# Patient Record
Sex: Male | Born: 1960 | Race: White | Hispanic: No | Smoking: Current every day smoker
Health system: Southern US, Community
[De-identification: ages and names within clinical notes are randomized; demographics above are authoritative.]

## PROBLEM LIST (undated history)

## (undated) DIAGNOSIS — F419 Anxiety disorder, unspecified: Secondary | ICD-10-CM

## (undated) DIAGNOSIS — R0600 Dyspnea, unspecified: Secondary | ICD-10-CM

## (undated) DIAGNOSIS — G8921 Chronic pain due to trauma: Secondary | ICD-10-CM

## (undated) DIAGNOSIS — F329 Major depressive disorder, single episode, unspecified: Secondary | ICD-10-CM

## (undated) DIAGNOSIS — F32A Depression, unspecified: Secondary | ICD-10-CM

## (undated) DIAGNOSIS — G709 Myoneural disorder, unspecified: Secondary | ICD-10-CM

## (undated) DIAGNOSIS — K219 Gastro-esophageal reflux disease without esophagitis: Secondary | ICD-10-CM

---

## 1997-09-27 ENCOUNTER — Other Ambulatory Visit: Admission: RE | Admit: 1997-09-27 | Discharge: 1997-09-27 | Payer: Self-pay | Admitting: Urology

## 1998-05-03 HISTORY — PX: KNEE ARTHROSCOPY W/ ACL RECONSTRUCTION: SHX1858

## 2000-01-05 ENCOUNTER — Encounter: Admission: RE | Admit: 2000-01-05 | Discharge: 2000-01-05 | Payer: Self-pay | Admitting: Occupational Medicine

## 2000-01-05 ENCOUNTER — Encounter: Payer: Self-pay | Admitting: Occupational Medicine

## 2000-07-25 ENCOUNTER — Encounter: Payer: Self-pay | Admitting: Specialist

## 2000-07-25 ENCOUNTER — Ambulatory Visit (HOSPITAL_COMMUNITY): Admission: RE | Admit: 2000-07-25 | Discharge: 2000-07-25 | Payer: Self-pay | Admitting: Specialist

## 2004-04-26 ENCOUNTER — Emergency Department (HOSPITAL_COMMUNITY): Admission: EM | Admit: 2004-04-26 | Discharge: 2004-04-26 | Payer: Self-pay | Admitting: Emergency Medicine

## 2004-05-01 ENCOUNTER — Ambulatory Visit: Payer: Self-pay | Admitting: Internal Medicine

## 2004-06-23 ENCOUNTER — Ambulatory Visit: Payer: Self-pay | Admitting: Internal Medicine

## 2004-06-29 ENCOUNTER — Ambulatory Visit: Payer: Self-pay | Admitting: Internal Medicine

## 2004-08-17 ENCOUNTER — Ambulatory Visit: Payer: Self-pay | Admitting: Internal Medicine

## 2004-10-16 ENCOUNTER — Ambulatory Visit: Payer: Self-pay | Admitting: Internal Medicine

## 2004-11-06 ENCOUNTER — Ambulatory Visit: Payer: Self-pay | Admitting: Internal Medicine

## 2005-07-09 ENCOUNTER — Ambulatory Visit: Payer: Self-pay | Admitting: Internal Medicine

## 2005-08-13 ENCOUNTER — Ambulatory Visit: Payer: Self-pay | Admitting: Internal Medicine

## 2005-08-27 ENCOUNTER — Ambulatory Visit: Payer: Self-pay | Admitting: Internal Medicine

## 2006-10-17 ENCOUNTER — Ambulatory Visit: Payer: Self-pay | Admitting: Internal Medicine

## 2013-02-14 ENCOUNTER — Emergency Department (HOSPITAL_COMMUNITY)
Admission: EM | Admit: 2013-02-14 | Discharge: 2013-02-14 | Disposition: A | Discharge status: Home / Self Care | Payer: BC Managed Care – PPO | Attending: Emergency Medicine | Admitting: Emergency Medicine

## 2013-02-14 ENCOUNTER — Encounter (HOSPITAL_COMMUNITY): Payer: Self-pay | Admitting: Emergency Medicine

## 2013-02-14 DIAGNOSIS — F17200 Nicotine dependence, unspecified, uncomplicated: Secondary | ICD-10-CM | POA: Insufficient documentation

## 2013-02-14 DIAGNOSIS — F329 Major depressive disorder, single episode, unspecified: Secondary | ICD-10-CM | POA: Insufficient documentation

## 2013-02-14 DIAGNOSIS — F1910 Other psychoactive substance abuse, uncomplicated: Secondary | ICD-10-CM

## 2013-02-14 DIAGNOSIS — F3289 Other specified depressive episodes: Secondary | ICD-10-CM | POA: Insufficient documentation

## 2013-02-14 LAB — CBC WITH DIFFERENTIAL/PLATELET
Basophils Absolute: 0 10*3/uL (ref 0.0–0.1)
Basophils Relative: 1 % (ref 0–1)
Eosinophils Absolute: 0.1 10*3/uL (ref 0.0–0.7)
Eosinophils Relative: 2 % (ref 0–5)
HCT: 43.7 % (ref 39.0–52.0)
Hemoglobin: 15.3 g/dL (ref 13.0–17.0)
Lymphocytes Relative: 16 % (ref 12–46)
Lymphs Abs: 1.2 10*3/uL (ref 0.7–4.0)
MCH: 34.6 pg — ABNORMAL HIGH (ref 26.0–34.0)
MCHC: 35 g/dL (ref 30.0–36.0)
MCV: 98.9 fL (ref 78.0–100.0)
Monocytes Absolute: 0.6 10*3/uL (ref 0.1–1.0)
Monocytes Relative: 8 % (ref 3–12)
Neutro Abs: 5.5 10*3/uL (ref 1.7–7.7)
Neutrophils Relative %: 73 % (ref 43–77)
Platelets: 318 10*3/uL (ref 150–400)
RBC: 4.42 MIL/uL (ref 4.22–5.81)
RDW: 12.2 % (ref 11.5–15.5)
WBC: 7.5 10*3/uL (ref 4.0–10.5)

## 2013-02-14 LAB — BASIC METABOLIC PANEL
BUN: 9 mg/dL (ref 6–23)
CO2: 27 mEq/L (ref 19–32)
Calcium: 9.3 mg/dL (ref 8.4–10.5)
Chloride: 98 mEq/L (ref 96–112)
Creatinine, Ser: 0.83 mg/dL (ref 0.50–1.35)
GFR calc Af Amer: >90 mL/min (ref >90)
GFR calc non Af Amer: >90 mL/min (ref >90)
Glucose, Bld: 105 mg/dL — ABNORMAL HIGH (ref 70–99)
Potassium: 4.6 mEq/L (ref 3.5–5.1)
Sodium: 135 mEq/L (ref 135–145)

## 2013-02-14 LAB — RAPID URINE DRUG SCREEN, HOSP PERFORMED
Amphetamines: NOT DETECTED
Barbiturates: NOT DETECTED
Benzodiazepines: POSITIVE — AB
Cocaine: NOT DETECTED
Opiates: NOT DETECTED
Tetrahydrocannabinol: POSITIVE — AB

## 2013-02-14 LAB — ETHANOL: Alcohol, Ethyl (B): <11 mg/dL (ref 0–11)

## 2013-02-14 NOTE — ED Notes (Signed)
Pt reports went to mental health today for help with detox from [facility]oh.  Reports was told his bp was high and was sent here for medical clearance.  Denies any SI or HI.

## 2013-02-14 NOTE — ED Provider Notes (Signed)
CSN: [id number]     Arrival date & time 02/14/13  1152 History   First MD Initiated Contact with Patient 02/14/13 1430     Chief Complaint  Patient presents with  . V70.1   (Consider location/radiation/quality/duration/timing/severity/associated sxs/prior Treatment) Patient is a 52 y.o. male presenting with altered mental status. The history is provided by the patient (pt was sent from [facility] for medical clearance and he is to back to [facility] tomorrow for treatment). No language interpreter was used.  Altered Mental Status Presenting symptoms: no behavior changes   Severity:  Moderate Most recent episode:  Today Episode history:  Multiple Timing:  Constant Chronicity:  Recurrent Associated symptoms: no abdominal pain, no hallucinations, no headaches, no rash and no seizures     History reviewed. No pertinent past medical history. History reviewed. No pertinent past surgical history. No family history on file. History  Substance Use Topics  . Smoking status: Current Every Day Smoker  . Smokeless tobacco: Not on file  . Alcohol Use: Yes     Comment: 12pack/day    Review of Systems  Constitutional: Negative for appetite change and fatigue.  HENT: Negative for congestion, ear discharge and sinus pressure.   Eyes: Negative for discharge.  Respiratory: Negative for cough.   Cardiovascular: Negative for chest pain.  Gastrointestinal: Negative for abdominal pain and diarrhea.  Genitourinary: Negative for frequency and hematuria.  Musculoskeletal: Negative for back pain.  Skin: Negative for rash.  Neurological: Negative for seizures and headaches.  Psychiatric/Behavioral: Positive for dysphoric mood. Negative for hallucinations.       Substance abuse    Allergies  Review of patient's allergies indicates no known allergies.  Home Medications   Current Outpatient Rx  Name  Route  Sig  Dispense  Refill  . ibuprofen (ADVIL,MOTRIN) 200 MG tablet   Oral   Take 400 mg by mouth  every 6 (six) hours as needed for pain.          BP 151/83  Pulse 82  Temp(Src) 98.5 F (36.9 C) (Oral)  Resp 20  Ht 5\' 8"  (1.727 m)  Wt 185 lb 5 oz (84.057 kg)  BMI 28.18 kg/m2  SpO2 96% Physical Exam  Constitutional: He is oriented to person, place, and time. He appears well-developed.  HENT:  Head: Normocephalic.  Eyes: Conjunctivae and EOM are normal. No scleral icterus.  Neck: Neck supple. No thyromegaly present.  Cardiovascular: Normal rate and regular rhythm.  Exam reveals no gallop and no friction rub.   No murmur heard. Pulmonary/Chest: No stridor. He has no wheezes. He has no rales. He exhibits no tenderness.  Abdominal: He exhibits no distension. There is no tenderness. There is no rebound.  Musculoskeletal: Normal range of motion. He exhibits no edema.  Lymphadenopathy:    He has no cervical adenopathy.  Neurological: He is oriented to person, place, and time. He exhibits normal muscle tone. Coordination normal.  Skin: No rash noted. No erythema.  Psychiatric:  Depressed not suicidal    ED Course  Procedures (including critical care time) Labs Review Labs Reviewed  CBC WITH DIFFERENTIAL - Abnormal; Notable for the following:    MCH 34.6 (*)    All other components within normal limits  BASIC METABOLIC PANEL - Abnormal; Notable for the following:    Glucose, Bld 105 (*)    All other components within normal limits  ETHANOL  URINE RAPID DRUG SCREEN (HOSP PERFORMED)   Imaging Review No results found.  EKG Interpretation   None  MDM   1. Substance abuse       Benny Lennert, MD 02/14/13 [phone]

## 2013-02-14 NOTE — ED Notes (Signed)
Pt presents from [facility] for medical clearance of hypertension so as he can seek treatment for alcohol abuse. Pt reports has " drank most of his life". Denies SI/HI.

## 2013-02-14 NOTE — ED Notes (Signed)
Pt wanded by security in triage. 

## 2014-05-03 DIAGNOSIS — G8921 Chronic pain due to trauma: Secondary | ICD-10-CM

## 2014-05-03 HISTORY — DX: Chronic pain due to trauma: G89.21

## 2015-02-14 HISTORY — PX: HAND SURGERY: SHX662

## 2015-08-18 ENCOUNTER — Other Ambulatory Visit (HOSPITAL_COMMUNITY): Payer: Self-pay | Admitting: Physical Medicine and Rehabilitation

## 2015-08-18 DIAGNOSIS — M79641 Pain in right hand: Secondary | ICD-10-CM

## 2015-08-21 ENCOUNTER — Encounter (HOSPITAL_COMMUNITY): Payer: Self-pay

## 2015-08-26 ENCOUNTER — Encounter (HOSPITAL_COMMUNITY)
Admission: RE | Admit: 2015-08-26 | Discharge: 2015-08-26 | Disposition: A | Discharge status: Home / Self Care | Payer: Worker's Compensation | Source: Ambulatory Visit | Attending: Physical Medicine and Rehabilitation | Admitting: Physical Medicine and Rehabilitation

## 2015-08-26 DIAGNOSIS — M79641 Pain in right hand: Secondary | ICD-10-CM | POA: Insufficient documentation

## 2015-08-26 MED ORDER — TECHNETIUM TC 99M MEDRONATE IV KIT
25.0000 | PACK | Freq: Once | INTRAVENOUS | Status: AC | PRN
  Administered 2015-08-26: 25 via INTRAVENOUS

## 2016-06-17 ENCOUNTER — Other Ambulatory Visit: Payer: Self-pay | Admitting: Orthopedic Surgery

## 2016-07-01 ENCOUNTER — Encounter (HOSPITAL_BASED_OUTPATIENT_CLINIC_OR_DEPARTMENT_OTHER): Payer: Self-pay | Admitting: *Deleted

## 2016-07-06 ENCOUNTER — Ambulatory Visit (HOSPITAL_BASED_OUTPATIENT_CLINIC_OR_DEPARTMENT_OTHER): Payer: Worker's Compensation | Admitting: Certified Registered"

## 2016-07-06 ENCOUNTER — Ambulatory Visit (HOSPITAL_BASED_OUTPATIENT_CLINIC_OR_DEPARTMENT_OTHER)
Admission: RE | Admit: 2016-07-06 | Discharge: 2016-07-06 | Disposition: A | Discharge status: Home / Self Care | Payer: Worker's Compensation | Source: Ambulatory Visit | Attending: Orthopedic Surgery | Admitting: Orthopedic Surgery

## 2016-07-06 ENCOUNTER — Encounter (HOSPITAL_BASED_OUTPATIENT_CLINIC_OR_DEPARTMENT_OTHER)
Admission: RE | Disposition: A | Discharge status: Home / Self Care | Payer: Self-pay | Source: Ambulatory Visit | Attending: Orthopedic Surgery

## 2016-07-06 ENCOUNTER — Encounter (HOSPITAL_BASED_OUTPATIENT_CLINIC_OR_DEPARTMENT_OTHER): Payer: Self-pay | Admitting: Certified Registered"

## 2016-07-06 DIAGNOSIS — S6722XD Crushing injury of left hand, subsequent encounter: Secondary | ICD-10-CM | POA: Diagnosis not present

## 2016-07-06 DIAGNOSIS — I10 Essential (primary) hypertension: Secondary | ICD-10-CM | POA: Insufficient documentation

## 2016-07-06 DIAGNOSIS — F419 Anxiety disorder, unspecified: Secondary | ICD-10-CM | POA: Diagnosis not present

## 2016-07-06 DIAGNOSIS — S6721XD Crushing injury of right hand, subsequent encounter: Secondary | ICD-10-CM | POA: Insufficient documentation

## 2016-07-06 DIAGNOSIS — I2510 Atherosclerotic heart disease of native coronary artery without angina pectoris: Secondary | ICD-10-CM | POA: Insufficient documentation

## 2016-07-06 DIAGNOSIS — K219 Gastro-esophageal reflux disease without esophagitis: Secondary | ICD-10-CM | POA: Insufficient documentation

## 2016-07-06 DIAGNOSIS — J449 Chronic obstructive pulmonary disease, unspecified: Secondary | ICD-10-CM | POA: Insufficient documentation

## 2016-07-06 DIAGNOSIS — G8929 Other chronic pain: Secondary | ICD-10-CM | POA: Insufficient documentation

## 2016-07-06 DIAGNOSIS — X58XXXD Exposure to other specified factors, subsequent encounter: Secondary | ICD-10-CM | POA: Diagnosis not present

## 2016-07-06 DIAGNOSIS — M24542 Contracture, left hand: Secondary | ICD-10-CM | POA: Diagnosis not present

## 2016-07-06 DIAGNOSIS — F329 Major depressive disorder, single episode, unspecified: Secondary | ICD-10-CM | POA: Diagnosis not present

## 2016-07-06 DIAGNOSIS — G5602 Carpal tunnel syndrome, left upper limb: Secondary | ICD-10-CM | POA: Diagnosis present

## 2016-07-06 DIAGNOSIS — G5603 Carpal tunnel syndrome, bilateral upper limbs: Secondary | ICD-10-CM | POA: Diagnosis not present

## 2016-07-06 DIAGNOSIS — G5622 Lesion of ulnar nerve, left upper limb: Secondary | ICD-10-CM | POA: Diagnosis not present

## 2016-07-06 DIAGNOSIS — F17210 Nicotine dependence, cigarettes, uncomplicated: Secondary | ICD-10-CM | POA: Diagnosis not present

## 2016-07-06 HISTORY — DX: Myoneural disorder, unspecified: G70.9

## 2016-07-06 HISTORY — PX: ULNAR NERVE TRANSPOSITION: SHX2595

## 2016-07-06 HISTORY — DX: Chronic pain due to trauma: G89.21

## 2016-07-06 HISTORY — DX: Major depressive disorder, single episode, unspecified: F32.9

## 2016-07-06 HISTORY — DX: Anxiety disorder, unspecified: F41.9

## 2016-07-06 HISTORY — DX: Depression, unspecified: F32.A

## 2016-07-06 HISTORY — DX: Dyspnea, unspecified: R06.00

## 2016-07-06 HISTORY — DX: Gastro-esophageal reflux disease without esophagitis: K21.9

## 2016-07-06 HISTORY — PX: CARPAL TUNNEL RELEASE: SHX101

## 2016-07-06 SURGERY — CARPAL TUNNEL RELEASE
Anesthesia: Regional | Site: Wrist | Laterality: Left

## 2016-07-06 MED ORDER — MIDAZOLAM HCL 2 MG/2ML IJ SOLN
1.0000 mg | INTRAMUSCULAR | Status: DC | PRN
  Administered 2016-07-06: 1 mg via INTRAVENOUS

## 2016-07-06 MED ORDER — ONDANSETRON HCL 4 MG/2ML IJ SOLN
INTRAMUSCULAR | Status: DC | PRN
  Administered 2016-07-06: 4 mg via INTRAVENOUS

## 2016-07-06 MED ORDER — CEFAZOLIN SODIUM-DEXTROSE 2-4 GM/100ML-% IV SOLN
2.0000 g | INTRAVENOUS | Status: AC
  Administered 2016-07-06: 2 g via INTRAVENOUS

## 2016-07-06 MED ORDER — MEPERIDINE HCL 25 MG/ML IJ SOLN: 6.2500 mg | INTRAMUSCULAR | Status: DC | PRN

## 2016-07-06 MED ORDER — LIDOCAINE HCL (CARDIAC) 20 MG/ML IV SOLN
INTRAVENOUS | Status: DC | PRN
  Administered 2016-07-06: 20 mg via INTRAVENOUS

## 2016-07-06 MED ORDER — FENTANYL CITRATE (PF) 100 MCG/2ML IJ SOLN
INTRAMUSCULAR | Status: AC
Start: 2016-07-06 — End: 2016-07-06
  Filled 2016-07-06: qty 2

## 2016-07-06 MED ORDER — FENTANYL CITRATE (PF) 100 MCG/2ML IJ SOLN
50.0000 ug | INTRAMUSCULAR | Status: DC | PRN
  Administered 2016-07-06: 50 ug via INTRAVENOUS

## 2016-07-06 MED ORDER — LIDOCAINE HCL (PF) 1 % IJ SOLN
INTRAMUSCULAR | Status: AC
  Filled 2016-07-06: qty 30

## 2016-07-06 MED ORDER — LACTATED RINGERS IV SOLN
INTRAVENOUS | Status: DC
  Administered 2016-07-06: 12:00:00 via INTRAVENOUS

## 2016-07-06 MED ORDER — CHLORHEXIDINE GLUCONATE 4 % EX LIQD: 60.0000 mL | Freq: Once | CUTANEOUS | Status: DC

## 2016-07-06 MED ORDER — CEFAZOLIN SODIUM-DEXTROSE 2-4 GM/100ML-% IV SOLN
INTRAVENOUS | Status: AC
  Filled 2016-07-06: qty 100

## 2016-07-06 MED ORDER — PROPOFOL 500 MG/50ML IV EMUL
INTRAVENOUS | Status: DC | PRN
  Administered 2016-07-06: 25 ug/kg/min via INTRAVENOUS

## 2016-07-06 MED ORDER — HYDROCODONE-ACETAMINOPHEN 5-325 MG PO TABS: 1.0000 | ORAL_TABLET | Freq: Four times a day (QID) | ORAL | 0 refills | Status: DC | PRN

## 2016-07-06 MED ORDER — BUPIVACAINE HCL (PF) 0.25 % IJ SOLN
INTRAMUSCULAR | Status: AC
  Filled 2016-07-06: qty 30

## 2016-07-06 MED ORDER — SCOPOLAMINE 1 MG/3DAYS TD PT72: 1.0000 | MEDICATED_PATCH | Freq: Once | TRANSDERMAL | Status: DC | PRN

## 2016-07-06 MED ORDER — MIDAZOLAM HCL 2 MG/2ML IJ SOLN
INTRAMUSCULAR | Status: AC
  Filled 2016-07-06: qty 2

## 2016-07-06 MED ORDER — PROMETHAZINE HCL 25 MG/ML IJ SOLN: 6.2500 mg | INTRAMUSCULAR | Status: DC | PRN

## 2016-07-06 MED ORDER — BUPIVACAINE-EPINEPHRINE (PF) 0.5% -1:200000 IJ SOLN
INTRAMUSCULAR | Status: DC | PRN
  Administered 2016-07-06: 30 mL via PERINEURAL

## 2016-07-06 MED ORDER — BUPIVACAINE HCL (PF) 0.25 % IJ SOLN
INTRAMUSCULAR | Status: DC | PRN
  Administered 2016-07-06: 6 mL

## 2016-07-06 MED ORDER — PROPOFOL 500 MG/50ML IV EMUL
INTRAVENOUS | Status: AC
  Filled 2016-07-06: qty 50

## 2016-07-06 SURGICAL SUPPLY — 55 items
BLADE MINI RND TIP GREEN BEAV (BLADE) IMPLANT
BLADE SURG 15 STRL LF DISP TIS (BLADE) ×2 IMPLANT
BLADE SURG 15 STRL SS (BLADE) ×4
BNDG CMPR 9X4 STRL LF SNTH (GAUZE/BANDAGES/DRESSINGS) ×2
BNDG COHESIVE 3X5 TAN STRL LF (GAUZE/BANDAGES/DRESSINGS) ×6 IMPLANT
BNDG ESMARK 4X9 LF (GAUZE/BANDAGES/DRESSINGS) ×4 IMPLANT
BNDG GAUZE ELAST 4 BULKY (GAUZE/BANDAGES/DRESSINGS) ×4 IMPLANT
CHLORAPREP W/TINT 26ML (MISCELLANEOUS) ×4 IMPLANT
CORDS BIPOLAR (ELECTRODE) ×4 IMPLANT
COVER BACK TABLE 60X90IN (DRAPES) ×4 IMPLANT
COVER MAYO STAND STRL (DRAPES) ×4 IMPLANT
CUFF TOURN SGL LL 18 NRW (TOURNIQUET CUFF) ×2 IMPLANT
CUFF TOURNIQUET SINGLE 18IN (TOURNIQUET CUFF) ×6 IMPLANT
DECANTER SPIKE VIAL GLASS SM (MISCELLANEOUS) IMPLANT
DRAPE EXTREMITY T 121X128X90 (DRAPE) ×4 IMPLANT
DRAPE SURG 17X23 STRL (DRAPES) ×4 IMPLANT
DRSG PAD ABDOMINAL 8X10 ST (GAUZE/BANDAGES/DRESSINGS) ×4 IMPLANT
GAUZE SPONGE 4X4 12PLY STRL (GAUZE/BANDAGES/DRESSINGS) ×4 IMPLANT
GAUZE SPONGE 4X4 16PLY XRAY LF (GAUZE/BANDAGES/DRESSINGS) IMPLANT
GAUZE XEROFORM 1X8 LF (GAUZE/BANDAGES/DRESSINGS) ×4 IMPLANT
GLOVE BIO SURGEON STRL SZ 6.5 (GLOVE) ×1 IMPLANT
GLOVE BIO SURGEONS STRL SZ 6.5 (GLOVE) ×1
GLOVE BIOGEL PI IND STRL 7.0 (GLOVE) IMPLANT
GLOVE BIOGEL PI IND STRL 8.5 (GLOVE) ×2 IMPLANT
GLOVE BIOGEL PI INDICATOR 7.0 (GLOVE) ×4
GLOVE BIOGEL PI INDICATOR 8.5 (GLOVE) ×2
GLOVE SURG ORTHO 8.0 STRL STRW (GLOVE) ×4 IMPLANT
GLOVE SURG SYN 7.5  E (GLOVE) ×2
GLOVE SURG SYN 7.5 E (GLOVE) ×2 IMPLANT
GLOVE SURG SYN 7.5 PF PI (GLOVE) IMPLANT
GOWN STRL REUS W/ TWL LRG LVL3 (GOWN DISPOSABLE) ×2 IMPLANT
GOWN STRL REUS W/TWL LRG LVL3 (GOWN DISPOSABLE) ×4
GOWN STRL REUS W/TWL XL LVL3 (GOWN DISPOSABLE) ×4 IMPLANT
LOOP VESSEL MAXI BLUE (MISCELLANEOUS) IMPLANT
NDL PRECISIONGLIDE 27X1.5 (NEEDLE) IMPLANT
NEEDLE PRECISIONGLIDE 27X1.5 (NEEDLE) IMPLANT
NS IRRIG 1000ML POUR BTL (IV SOLUTION) ×4 IMPLANT
PACK BASIN DAY SURGERY FS (CUSTOM PROCEDURE TRAY) ×4 IMPLANT
PAD CAST 3X4 CTTN HI CHSV (CAST SUPPLIES) IMPLANT
PAD CAST 4YDX4 CTTN HI CHSV (CAST SUPPLIES) ×2 IMPLANT
PADDING CAST COTTON 3X4 STRL (CAST SUPPLIES)
PADDING CAST COTTON 4X4 STRL (CAST SUPPLIES) ×4
SLEEVE SCD COMPRESS KNEE MED (MISCELLANEOUS) ×4 IMPLANT
SLING ARM FOAM STRAP LRG (SOFTGOODS) ×2 IMPLANT
SPLINT PLASTER CAST XFAST 3X15 (CAST SUPPLIES) IMPLANT
SPLINT PLASTER XTRA FASTSET 3X (CAST SUPPLIES)
STOCKINETTE 4X48 STRL (DRAPES) ×4 IMPLANT
SUT ETHILON 4 0 PS 2 18 (SUTURE) ×4 IMPLANT
SUT VIC AB 2-0 SH 27 (SUTURE) ×4
SUT VIC AB 2-0 SH 27XBRD (SUTURE) ×2 IMPLANT
SUT VICRYL 4-0 PS2 18IN ABS (SUTURE) ×4 IMPLANT
SYR BULB 3OZ (MISCELLANEOUS) ×4 IMPLANT
SYR CONTROL 10ML LL (SYRINGE) IMPLANT
TOWEL OR 17X24 6PK STRL BLUE (TOWEL DISPOSABLE) ×4 IMPLANT
UNDERPAD 30X30 (UNDERPADS AND DIAPERS) ×4 IMPLANT

## 2016-07-06 NOTE — Discharge Instructions (Addendum)

## 2016-07-06 NOTE — Anesthesia Procedure Notes (Addendum)
Anesthesia Regional Block: Supraclavicular block   Pre-Anesthetic Checklist: ,, timeout performed, Correct Patient, Correct Site, Correct Laterality, Correct Procedure, Correct Position, site marked, Risks and benefits discussed,  Surgical consent,  Pre-op evaluation,  At surgeon's request and post-op pain management  Laterality: Left  Prep: chloraprep       Needles:  Injection technique: Single-shot  Needle Type: Stimiplex          Additional Needles:   Procedures: ultrasound guided,,,,,,,,  Narrative:  Start time: 07/06/2016 12:00 PM End time: 07/06/2016 12:15 PM Injection made incrementally with aspirations every 5 mL.  Performed by: Personally  Anesthesiologist: Lewie LoronGERMEROTH, Clete  Additional Notes: Risks, benefits and alternative to block explained extensively.  Patient tolerated procedure well, without complications.

## 2016-07-06 NOTE — Progress Notes (Signed)
Assisted Dr. Germeroth with left, ultrasound guided, supraclavicular block. Side rails up, monitors on throughout procedure. See vital signs in flow sheet. Tolerated Procedure well. 

## 2016-07-06 NOTE — Anesthesia Procedure Notes (Signed)
Procedure Name: MAC Date/Time: 07/06/2016 12:31 PM Performed by: Orestes Geiman D Pre-anesthesia Checklist: Emergency Drugs available, Suction available, Patient being monitored, Patient identified and Timeout performed Patient Re-evaluated:Patient Re-evaluated prior to inductionOxygen Delivery Method: Simple face mask

## 2016-07-06 NOTE — H&P (Signed)
Fernando Holmes is an 56 y.o. male.   Chief Complaint: numbness and pain left hand HPI: Fernando Holmes is a 56 year old right-hand-dominant male seen at the request the insurance company for an independent medical evaluation. He sustained an injury to both hands when a metal break this came down on his hands which were inside as he was attempting to remove a portion of material. The injury occurred on March 07, 2015. Both hands were involved in the brake press. This occurred when his coworker accidentally hit the machine and I cycled it while his hands were still in the machine causing the machine to come down and clamped both hands. The left side was opened. This occurred through the metacarpals of each hand. He was seen at [facility] emergency room where these were managed and he was referred to Dr. Case. He underwent pinning of the fractures on his left side. He was treated with closed splinting on his right side. The day of surgery was 02/14/15. The pins were maintained for approximately 3-4 weeks. He was then begun on therapy both hands. He is now complaining of his hands locking up. He relates a sharp aching type pain with a VAS score of 8/10. His right is not as bad as his left. He is seen with his wife and she states that he will have spasms in his left shoulder. He complains of pain going up his arm on both sides of his hand and arm on his left side. He is complaining of pain in his right side in the midportion of his hand. He has no prior history of injuries. He has no history of injury to the neck. He was seen by Dr. [name] he states did very little for him. He subsequently had a bone scan done showing a delayed uptake in the first metacarpal carpal joints and the second and third metacarpals on his right side. He has had nerve conductions done by Dr. [name]. These were performed by Dr. [name] on 06/17/2015 . 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 Hs being  seen by Dr. [name] at the present time. He has had 3 stellate ganglion blocks. He states these have not given him significant relief. He has apparently been scheduled for a spinal cord stimulator. He is now taking trazodone and gabapentin which give him minimal relief. He continues to complain of a sharp aching pain on his left side with a VAS score of 8/10. He has had a psychological evaluation in addition. This was done by Dr. [name].He has been seeing Fernando Holmes for therapies. He states that his pain has slightly improved but is still present. He has been taking Celebrex which has given him some relief. He has seen some mobility of his fingers with increased grip strength With the therapy.He does complain of the numbness and tingling.          Past Medical History:  Diagnosis Date  . Anxiety   . Chronic pain due to injury 2016   hands crushed by hydrolic press  . Depression   . Dyspnea   . GERD (gastroesophageal reflux disease)   . Hypertension    has dx of HTN but does not see MD or take meds  . Neuromuscular disorder [facility])     Past Surgical History:  Procedure Laterality Date  . HAND SURGERY Left 02/14/2015   due to injury  . KNEE ARTHROSCOPY W/ ACL RECONSTRUCTION Left 2000    History reviewed. No  pertinent family history. Social History:  reports that he has been smoking Cigarettes.  He has a 40.00 pack-year smoking history. He has never used smokeless tobacco. He reports that he drinks alcohol. He reports that he uses drugs, including Marijuana.  Allergies: No Known Allergies  No prescriptions prior to admission.    No results found for this or any previous visit (from the past 48 hour(s)).  No results found.   Pertinent items are noted in HPI.  Height 5\' 8"  (1.727 m), weight 97.5 kg (215 lb).  General appearance: alert, cooperative and appears stated age Head: Normocephalic, without obvious abnormality Neck: no JVD Resp: clear to auscultation  bilaterally Cardio: regular rate and rhythm, S1, S2 normal, no murmur, click, rub or gallop GI: soft, non-tender; bowel sounds normal; no masses,  no organomegaly Extremities:numbness left hand Pulses: 2+ and symmetric Skin: Skin color, texture, turgor normal. No rashes or lesions Neurologic: Grossly normal Incision/Wound: na  Assessment/Plan Assessment:  1. Crush injury of hands 2. Multiple fractures of metacarpal bone, open, initial encounter  3. Contracture of finger joint, left  4. Bilateral carpal tunnel syndrome  5. 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 We recommend release of his median and ulnar nerve at his elbow with possible transposition of the ulnar nerve on the left side. Will defer any release of the intrinsics at the present time. We will do this to see if he can get by with the releases were to help with his pain numbness and tingling.  Pre-peri-and postoperative course are discussed along with risk applications. Is where there is no guarantee to the surgery the possibility of infection recurrence injury to arteries nerves tendons incomplete release symptoms dystrophy. He is scheduled for left carpal tunnel cubital tunnel decompression possible transposition as an outpatient under regional anesthesia on the left side.       Fernando Holmes R 07/06/2016, 10:07 AM

## 2016-07-06 NOTE — Transfer of Care (Signed)
Immediate Anesthesia Transfer of Care Note  Patient: Fernando Holmes  Procedure(s) Performed: Procedure(s): CARPAL TUNNEL RELEASE (Left) LEFT ULNAR NERVE DECOMPRESSION, (Left)  Patient Location: PACU  Anesthesia Type:MAC combined with regional for post-op pain  Level of Consciousness: awake, alert , oriented and patient cooperative  Airway & Oxygen Therapy: Patient Spontanous Breathing and Patient connected to face mask oxygen  Post-op Assessment: Report given to RN and Post -op Vital signs reviewed and stable  Post vital signs: Reviewed and stable  Last Vitals:  Vitals:   07/06/16 1116 07/06/16 1311  BP: 138/81 (!) 155/97  Pulse:  80  Resp: 18 16  Temp: 36.5 C     Last Pain:  Vitals:   07/06/16 1116  TempSrc: Oral  PainSc: 5       Patients Stated Pain Goal: 3 (32/44/01 0272)  Complications: No apparent anesthesia complications

## 2016-07-06 NOTE — Op Note (Signed)
Dictation Number [id number]

## 2016-07-06 NOTE — Anesthesia Preprocedure Evaluation (Addendum)
Anesthesia Evaluation  Patient identified by MRN, date of birth, ID band Patient awake    Reviewed: Allergy & Precautions, NPO status , Patient's Chart, lab work & pertinent test results  Airway Mallampati: III  TM Distance: >3 FB Neck ROM: Full    Dental  (+) Dental Advisory Given   Pulmonary shortness of breath, COPD, Current Smoker,     + decreased breath sounds+ wheezing      Cardiovascular hypertension, + CAD  Normal cardiovascular exam Rhythm:Regular Rate:Normal     Neuro/Psych PSYCHIATRIC DISORDERS Anxiety Depression negative neurological ROS  negative psych ROS   GI/Hepatic Neg liver ROS, GERD  Medicated,  Endo/Other  negative endocrine ROS  Renal/GU negative Renal ROS  negative genitourinary   Musculoskeletal negative musculoskeletal ROS (+)   Abdominal (+) + obese,   Peds negative pediatric ROS (+)  Hematology negative hematology ROS (+)   Anesthesia Other Findings   Reproductive/Obstetrics negative OB ROS                           Anesthesia Physical Anesthesia Plan  ASA: III  Anesthesia Plan: Regional   Post-op Pain Management:    Induction:   Airway Management Planned:   Additional Equipment:   Intra-op Plan:   Post-operative Plan:   Informed Consent: I have reviewed the patients History and Physical, chart, labs and discussed the procedure including the risks, benefits and alternatives for the proposed anesthesia with the patient or authorized representative who has indicated his/her understanding and acceptance.   Dental advisory given  Plan Discussed with: CRNA  Anesthesia Plan Comments: (Pt with room air sat of 86%, audible wheezing. Likely severe COPD. Not medically managed. Discussed straight regional anesthetic with very little sedation. Patient certainly needs a pulmonology referral. )       Anesthesia Quick Evaluation

## 2016-07-06 NOTE — Brief Op Note (Signed)
07/06/2016  1:10 PM  PATIENT:  Jule EconomyJohn Brigandi  56 y.o. male  PRE-OPERATIVE DIAGNOSIS:  LEFT CARPAL TUNNEL SYNDROME, LEFT CUBITAL TUNNEL  POST-OPERATIVE DIAGNOSIS:  LEFT CARPAL TUNNEL SYNDROME, LEFT CUBITAL TUNNEL  PROCEDURE:  Procedure(s): CARPAL TUNNEL RELEASE (Left) LEFT ULNAR NERVE DECOMPRESSION, (Left)  SURGEON:  Surgeon(s) and Role:    * Cindee SaltGary Milon Dethloff, MD - Primary  PHYSICIAN ASSISTANT:   ASSISTANTS: none   ANESTHESIA:   local and regional  EBL:  Total I/O In: 800 [I.V.:800] Out: -   BLOOD ADMINISTERED:none  DRAINS: none   LOCAL MEDICATIONS USED:  BUPIVICAINE   SPECIMEN:  No Specimen  DISPOSITION OF SPECIMEN:  N/A  COUNTS:  YES  TOURNIQUET:   Total Tourniquet Time Documented: Upper Arm (Left) - 35 minutes Total: Upper Arm (Left) - 35 minutes   DICTATION: .Other Dictation: Dictation Number [id number]  PLAN OF CARE: Discharge to home after PACU  PATIENT DISPOSITION:  PACU - hemodynamically stable.

## 2016-07-06 NOTE — Anesthesia Postprocedure Evaluation (Signed)
Anesthesia Post Note  Patient: Fernando EconomyJohn Holmes  Procedure(s) Performed: Procedure(s) (LRB): CARPAL TUNNEL RELEASE (Left) LEFT ULNAR NERVE DECOMPRESSION, (Left)  Patient location during evaluation: PACU Anesthesia Type: Regional Level of consciousness: sedated and patient cooperative Pain management: pain level controlled Vital Signs Assessment: post-procedure vital signs reviewed and stable Respiratory status: spontaneous breathing Cardiovascular status: stable Anesthetic complications: no       Last Vitals:  Vitals:   07/06/16 1345 07/06/16 1436  BP: 122/71 (!) 144/89  Pulse: 72 82  Resp: 19 20  Temp:  36.6 C    Last Pain:  Vitals:   07/06/16 1436  TempSrc: Oral  PainSc: 0-No pain                 Fernando LoronJohn Johnthomas Holmes

## 2016-07-07 ENCOUNTER — Encounter (HOSPITAL_BASED_OUTPATIENT_CLINIC_OR_DEPARTMENT_OTHER): Payer: Self-pay | Admitting: Orthopedic Surgery

## 2016-07-07 NOTE — Op Note (Signed)
NAME:  Fernando Holmes, WEEKLY NO.:  [id number]  MEDICAL RECORD NO.:  [id number]  LOCATION:                                 FACILITY:  PHYSICIAN:  Cindee Salt, M.D.            DATE OF BIRTH:  DATE OF PROCEDURE:  07/06/2016 DATE OF DISCHARGE:                              OPERATIVE REPORT   PREOPERATIVE DIAGNOSES: 1. Carpal tunnel syndrome, left hand. 2. Cubital tunnel syndrome, left elbow.  POSTOPERATIVE DIAGNOSES: 1. Carpal tunnel syndrome, left hand. 2. Cubital tunnel syndrome, left elbow.  OPERATION:  Decompression median nerve at the wrist, decompression ulnar nerve at the elbow, left side.  SURGEON:  Cindee Salt, MD.  ASSISTANT:  None.  ANESTHESIA:  Supraclavicular block.  ANESTHESIOLOGIST:   [place].  HISTORY:  The patient is a 56 year old male, suffered crush injuries to both hands.  He has undergone multiple procedures.  He is seen now with our carpal tunnel, cubital tunnel, nerve conduction is positive.  This has not responded to conservative treatment.  He has elected to undergo surgical decompression to the median and ulnar nerve of his left arm. Pre, peri, and postoperative course have been discussed along with risks and complications.  He is aware that there is no guarantee to the surgery; the possibility of infection; recurrence of injury to arteries, nerves, tendons; incomplete relief of symptoms and dystrophy.  In preoperative area, the patient is seen, the extremity marked by both patient and surgeon.  Antibiotic given.  PROCEDURE IN DETAIL:  The patient was brought to the operating room. After a supraclavicular block was carried out in the preoperative area, he was prepped using ChloraPrep in a supine position.  Anesthesia was carried out under the direction of the Anesthesia Department.  A 3- minute dry time was taken and time-out taken confirming patient and procedure.  The limb was exsanguinated with an Esmarch bandage. Tourniquet  placed on the upper arm, was inflated to 250 mmHg.  A longitudinal incision was made in the left palm, carried down through subcutaneous tissue.  Bleeders were electrocauterized.  Palmar fascia was split.  The superficial palmar arch was identified.  The flexor tendon to the ring and little finger was identified.  To the ulnar side of the median nerve, the carpal retinaculum was incised with sharp dissection after placing retractors protecting the median nerve radially and the ulnar nerve ulnarly.  A right-angle and Sewell retractor were placed between skin and forearm fascia.  Fascia was released, but the nerve was found to be densely adherent along with the flexor tendons proximally.  The wound was then extended proximally.  The distal forearm fascia and remainder of the palmar fascia was identified with protection to the median nerve, this was released.  The nerve was then explored. The area of compression was apparent.  Motor branch entered into muscle. No further lesions were identified.  The wound was irrigated and closed with interrupted 4-0 nylon sutures.  A separate incision was then made on the medial side of his left elbow.  This was carried down through subcutaneous tissue.  This was just anterior to the medial epicondyle. Weitlaners were placed  allowing visualization of the subcutaneous tissue with blunt dissection.  This was carried down to the Osborne's fascia. This was released on its posterior aspect.  The ulnar nerve was identified.  The flexor carpi ulnaris was then isolated.  The skin and subcutaneous tissue were dissected free from the overlying extensor carpi ulnaris fascia.  The fascia was split.  The muscle was split into its 2 muscle bellies.  A KMI guide for carpal tunnel release was then placed between the ulnar nerve distally and right-angle scissors from the ENT set, angled in nature were then used to cut the deep fascia protecting the ulnar nerve beneath the  retractor and KMI guide. Attention was then addressed proximally.  The upper arm fascia was then isolated from the overlying skin and subcutaneous tissue.  The retractors were placed allowing visualization of the fascia proximally. [facility] guide was then used to dissect the nerve from the fascia proximally.  The right-angle ENT sutures were then used to cut the brachial fascia proximally for approximately 8 cm proximally.  The nerve was then identified over its entire course, found to be intact.  With flexion of the elbow, there was no subluxation or dislocation.  The wound was copiously irrigated with saline.  The Osborne's fascia was then sutured to the posterior skin flap with 2-0 Vicryl sutures after irrigation of the wound.  The subcutaneous tissue was closed with interrupted 2-0 Vicryl and skin with interrupted 4-0 nylon sutures. Local infiltration with 0.25% bupivacaine was given at the beginning of the ulnar nerve procedure and that he complained of some feeling. Approximately 8 mL was used.  A sterile compressive dressing to the wrist with the fingers free was applied.  A sterile compressive dressing to the elbow was applied.  On deflation of the tourniquet, all fingers immediately pinked.  He was taken to the recovery room for observation in satisfactory condition.  He will be discharged to home [facility] [place].          ______________________________ Cindee SaltGary Ralphael Southgate, M.D.     GK/MEDQ  D:  07/06/2016  T:  07/06/2016  Job:  [id number]

## 2017-02-28 ENCOUNTER — Encounter (HOSPITAL_BASED_OUTPATIENT_CLINIC_OR_DEPARTMENT_OTHER): Payer: Self-pay | Admitting: *Deleted

## 2017-03-04 ENCOUNTER — Other Ambulatory Visit: Payer: Self-pay | Admitting: Orthopedic Surgery

## 2017-03-08 ENCOUNTER — Ambulatory Visit (HOSPITAL_BASED_OUTPATIENT_CLINIC_OR_DEPARTMENT_OTHER)
Admission: RE | Admit: 2017-03-08 | Discharge: 2017-03-08 | Disposition: A | Discharge status: Home / Self Care | Payer: Worker's Compensation | Source: Ambulatory Visit | Attending: Orthopedic Surgery | Admitting: Orthopedic Surgery

## 2017-03-08 ENCOUNTER — Other Ambulatory Visit: Payer: Self-pay

## 2017-03-08 ENCOUNTER — Ambulatory Visit (HOSPITAL_BASED_OUTPATIENT_CLINIC_OR_DEPARTMENT_OTHER): Payer: Worker's Compensation | Admitting: Anesthesiology

## 2017-03-08 ENCOUNTER — Encounter (HOSPITAL_BASED_OUTPATIENT_CLINIC_OR_DEPARTMENT_OTHER)
Admission: RE | Disposition: A | Discharge status: Home / Self Care | Payer: Self-pay | Source: Ambulatory Visit | Attending: Orthopedic Surgery

## 2017-03-08 ENCOUNTER — Encounter (HOSPITAL_BASED_OUTPATIENT_CLINIC_OR_DEPARTMENT_OTHER): Payer: Self-pay | Admitting: Certified Registered"

## 2017-03-08 DIAGNOSIS — K219 Gastro-esophageal reflux disease without esophagitis: Secondary | ICD-10-CM | POA: Diagnosis not present

## 2017-03-08 DIAGNOSIS — Z79899 Other long term (current) drug therapy: Secondary | ICD-10-CM | POA: Insufficient documentation

## 2017-03-08 DIAGNOSIS — F419 Anxiety disorder, unspecified: Secondary | ICD-10-CM | POA: Insufficient documentation

## 2017-03-08 DIAGNOSIS — E669 Obesity, unspecified: Secondary | ICD-10-CM | POA: Diagnosis not present

## 2017-03-08 DIAGNOSIS — F17210 Nicotine dependence, cigarettes, uncomplicated: Secondary | ICD-10-CM | POA: Insufficient documentation

## 2017-03-08 DIAGNOSIS — G5621 Lesion of ulnar nerve, right upper limb: Secondary | ICD-10-CM | POA: Insufficient documentation

## 2017-03-08 DIAGNOSIS — Z6832 Body mass index (BMI) 32.0-32.9, adult: Secondary | ICD-10-CM | POA: Insufficient documentation

## 2017-03-08 DIAGNOSIS — F329 Major depressive disorder, single episode, unspecified: Secondary | ICD-10-CM | POA: Insufficient documentation

## 2017-03-08 DIAGNOSIS — G5601 Carpal tunnel syndrome, right upper limb: Secondary | ICD-10-CM | POA: Diagnosis not present

## 2017-03-08 DIAGNOSIS — J449 Chronic obstructive pulmonary disease, unspecified: Secondary | ICD-10-CM | POA: Diagnosis not present

## 2017-03-08 DIAGNOSIS — Z87828 Personal history of other (healed) physical injury and trauma: Secondary | ICD-10-CM | POA: Insufficient documentation

## 2017-03-08 DIAGNOSIS — G8921 Chronic pain due to trauma: Secondary | ICD-10-CM | POA: Diagnosis not present

## 2017-03-08 HISTORY — PX: CARPAL TUNNEL RELEASE: SHX101

## 2017-03-08 HISTORY — PX: ULNAR NERVE TRANSPOSITION: SHX2595

## 2017-03-08 SURGERY — ULNAR NERVE DECOMPRESSION/TRANSPOSITION
Anesthesia: Regional | Site: Wrist | Laterality: Right

## 2017-03-08 MED ORDER — BUPIVACAINE HCL (PF) 0.25 % IJ SOLN: INTRAMUSCULAR | Status: DC | PRN

## 2017-03-08 MED ORDER — LACTATED RINGERS IV SOLN
INTRAVENOUS | Status: DC
  Administered 2017-03-08: 11:00:00 via INTRAVENOUS

## 2017-03-08 MED ORDER — MIDAZOLAM HCL 2 MG/2ML IJ SOLN
INTRAMUSCULAR | Status: AC
  Filled 2017-03-08: qty 2

## 2017-03-08 MED ORDER — FENTANYL CITRATE (PF) 100 MCG/2ML IJ SOLN
25.0000 ug | INTRAMUSCULAR | Status: DC | PRN
  Administered 2017-03-08: 50 ug via INTRAVENOUS

## 2017-03-08 MED ORDER — CEFAZOLIN SODIUM-DEXTROSE 2-4 GM/100ML-% IV SOLN
2.0000 g | INTRAVENOUS | Status: AC
  Administered 2017-03-08: 2 g via INTRAVENOUS

## 2017-03-08 MED ORDER — FENTANYL CITRATE (PF) 100 MCG/2ML IJ SOLN
50.0000 ug | INTRAMUSCULAR | Status: DC | PRN
  Administered 2017-03-08 (×2): 50 ug via INTRAVENOUS

## 2017-03-08 MED ORDER — PROPOFOL 500 MG/50ML IV EMUL
INTRAVENOUS | Status: DC | PRN
  Administered 2017-03-08: 25 ug/kg/min via INTRAVENOUS

## 2017-03-08 MED ORDER — FENTANYL CITRATE (PF) 100 MCG/2ML IJ SOLN
INTRAMUSCULAR | Status: AC
  Filled 2017-03-08: qty 2

## 2017-03-08 MED ORDER — SCOPOLAMINE 1 MG/3DAYS TD PT72: 1.0000 | MEDICATED_PATCH | Freq: Once | TRANSDERMAL | Status: DC | PRN

## 2017-03-08 MED ORDER — ONDANSETRON HCL 4 MG/2ML IJ SOLN
INTRAMUSCULAR | Status: DC | PRN
  Administered 2017-03-08: 4 mg via INTRAVENOUS

## 2017-03-08 MED ORDER — LACTATED RINGERS IV SOLN: 500.0000 mL | INTRAVENOUS | Status: DC

## 2017-03-08 MED ORDER — CHLORHEXIDINE GLUCONATE 4 % EX LIQD: 60.0000 mL | Freq: Once | CUTANEOUS | Status: DC

## 2017-03-08 MED ORDER — HYDROCODONE-ACETAMINOPHEN 5-325 MG PO TABS
ORAL_TABLET | ORAL | Status: AC
  Filled 2017-03-08: qty 1

## 2017-03-08 MED ORDER — HYDROCODONE-ACETAMINOPHEN 5-325 MG PO TABS: 1.0000 | ORAL_TABLET | Freq: Four times a day (QID) | ORAL | 0 refills | Status: DC | PRN

## 2017-03-08 MED ORDER — ALBUTEROL SULFATE (2.5 MG/3ML) 0.083% IN NEBU
INHALATION_SOLUTION | RESPIRATORY_TRACT | Status: AC
  Filled 2017-03-08: qty 3

## 2017-03-08 MED ORDER — BUPIVACAINE HCL (PF) 0.25 % IJ SOLN
INTRAMUSCULAR | Status: AC
  Filled 2017-03-08: qty 30

## 2017-03-08 MED ORDER — ROPIVACAINE HCL 5 MG/ML IJ SOLN
INTRAMUSCULAR | Status: DC | PRN
  Administered 2017-03-08: 30 mL via PERINEURAL

## 2017-03-08 MED ORDER — MIDAZOLAM HCL 2 MG/2ML IJ SOLN
1.0000 mg | INTRAMUSCULAR | Status: DC | PRN
  Administered 2017-03-08: 1 mg via INTRAVENOUS

## 2017-03-08 MED ORDER — ONDANSETRON HCL 4 MG/2ML IJ SOLN: 4.0000 mg | Freq: Once | INTRAMUSCULAR | Status: DC | PRN

## 2017-03-08 MED ORDER — HYDROCODONE-ACETAMINOPHEN 5-325 MG PO TABS
1.0000 | ORAL_TABLET | Freq: Once | ORAL | Status: AC
  Administered 2017-03-08: 1 via ORAL

## 2017-03-08 MED ORDER — CEFAZOLIN SODIUM-DEXTROSE 2-4 GM/100ML-% IV SOLN
INTRAVENOUS | Status: AC
  Filled 2017-03-08: qty 100

## 2017-03-08 MED ORDER — PROPOFOL 10 MG/ML IV BOLUS
INTRAVENOUS | Status: DC | PRN
  Administered 2017-03-08: 100 mg via INTRAVENOUS

## 2017-03-08 MED ORDER — ALBUTEROL SULFATE (2.5 MG/3ML) 0.083% IN NEBU
2.5000 mg | INHALATION_SOLUTION | Freq: Once | RESPIRATORY_TRACT | Status: AC
  Administered 2017-03-08: 2.5 mg via RESPIRATORY_TRACT

## 2017-03-08 MED ORDER — LIDOCAINE HCL (CARDIAC) 20 MG/ML IV SOLN
INTRAVENOUS | Status: DC | PRN
  Administered 2017-03-08: 30 mg via INTRAVENOUS

## 2017-03-08 SURGICAL SUPPLY — 51 items
BLADE MINI RND TIP GREEN BEAV (BLADE) IMPLANT
BLADE SURG 15 STRL LF DISP TIS (BLADE) ×2 IMPLANT
BLADE SURG 15 STRL SS (BLADE) ×4
BNDG CMPR 9X4 STRL LF SNTH (GAUZE/BANDAGES/DRESSINGS) ×2
BNDG COHESIVE 3X5 TAN STRL LF (GAUZE/BANDAGES/DRESSINGS) ×8 IMPLANT
BNDG ESMARK 4X9 LF (GAUZE/BANDAGES/DRESSINGS) ×4 IMPLANT
BNDG GAUZE ELAST 4 BULKY (GAUZE/BANDAGES/DRESSINGS) ×4 IMPLANT
CHLORAPREP W/TINT 26ML (MISCELLANEOUS) ×4 IMPLANT
CORD BIPOLAR FORCEPS 12FT (ELECTRODE) ×4 IMPLANT
COVER BACK TABLE 60X90IN (DRAPES) ×4 IMPLANT
COVER MAYO STAND STRL (DRAPES) ×4 IMPLANT
CUFF TOURN SGL LL 18 NRW (TOURNIQUET CUFF) ×4 IMPLANT
DECANTER SPIKE VIAL GLASS SM (MISCELLANEOUS) ×2 IMPLANT
DRAPE EXTREMITY T 121X128X90 (DRAPE) ×4 IMPLANT
DRAPE SURG 17X23 STRL (DRAPES) ×4 IMPLANT
DRSG PAD ABDOMINAL 8X10 ST (GAUZE/BANDAGES/DRESSINGS) ×4 IMPLANT
GAUZE SPONGE 4X4 12PLY STRL (GAUZE/BANDAGES/DRESSINGS) ×4 IMPLANT
GAUZE SPONGE 4X4 16PLY XRAY LF (GAUZE/BANDAGES/DRESSINGS) ×2 IMPLANT
GAUZE XEROFORM 1X8 LF (GAUZE/BANDAGES/DRESSINGS) ×4 IMPLANT
GLOVE BIO SURGEON STRL SZ 6.5 (GLOVE) ×1 IMPLANT
GLOVE BIO SURGEONS STRL SZ 6.5 (GLOVE) ×1
GLOVE BIOGEL PI IND STRL 7.0 (GLOVE) IMPLANT
GLOVE BIOGEL PI IND STRL 8.5 (GLOVE) ×2 IMPLANT
GLOVE BIOGEL PI INDICATOR 7.0 (GLOVE) ×2
GLOVE BIOGEL PI INDICATOR 8.5 (GLOVE) ×2
GLOVE SURG ORTHO 8.0 STRL STRW (GLOVE) ×4 IMPLANT
GOWN STRL REUS W/ TWL LRG LVL3 (GOWN DISPOSABLE) ×2 IMPLANT
GOWN STRL REUS W/TWL LRG LVL3 (GOWN DISPOSABLE) ×4
GOWN STRL REUS W/TWL XL LVL3 (GOWN DISPOSABLE) ×4 IMPLANT
LOOP VESSEL MAXI BLUE (MISCELLANEOUS) IMPLANT
NDL PRECISIONGLIDE 27X1.5 (NEEDLE) IMPLANT
NEEDLE PRECISIONGLIDE 27X1.5 (NEEDLE) ×4 IMPLANT
NS IRRIG 1000ML POUR BTL (IV SOLUTION) ×4 IMPLANT
PACK BASIN DAY SURGERY FS (CUSTOM PROCEDURE TRAY) ×4 IMPLANT
PAD CAST 3X4 CTTN HI CHSV (CAST SUPPLIES) IMPLANT
PAD CAST 4YDX4 CTTN HI CHSV (CAST SUPPLIES) ×2 IMPLANT
PADDING CAST COTTON 3X4 STRL (CAST SUPPLIES) ×4
PADDING CAST COTTON 4X4 STRL (CAST SUPPLIES) ×4
SLEEVE SCD COMPRESS KNEE MED (MISCELLANEOUS) ×4 IMPLANT
SLING ARM FOAM STRAP LRG (SOFTGOODS) ×2 IMPLANT
SPLINT PLASTER CAST XFAST 3X15 (CAST SUPPLIES) IMPLANT
SPLINT PLASTER XTRA FASTSET 3X (CAST SUPPLIES)
STOCKINETTE 4X48 STRL (DRAPES) ×4 IMPLANT
SUT ETHILON 4 0 PS 2 18 (SUTURE) ×4 IMPLANT
SUT VIC AB 2-0 SH 27 (SUTURE) ×4
SUT VIC AB 2-0 SH 27XBRD (SUTURE) ×2 IMPLANT
SUT VICRYL 4-0 PS2 18IN ABS (SUTURE) ×4 IMPLANT
SYR BULB 3OZ (MISCELLANEOUS) ×4 IMPLANT
SYR CONTROL 10ML LL (SYRINGE) ×2 IMPLANT
TOWEL OR 17X24 6PK STRL BLUE (TOWEL DISPOSABLE) ×4 IMPLANT
UNDERPAD 30X30 (UNDERPADS AND DIAPERS) ×4 IMPLANT

## 2017-03-08 NOTE — Progress Notes (Signed)
Assisted Dr. Ellender with left, ultrasound guided, supraclavicular block. Side rails up, monitors on throughout procedure. See vital signs in flow sheet. Tolerated Procedure well. 

## 2017-03-08 NOTE — Brief Op Note (Signed)
03/08/2017  11:55 AM  PATIENT:  Fernando Holmes  56 y.o. male  PRE-OPERATIVE DIAGNOSIS:  Cubital Tunnel Right   Carpal Tunnel Right  POST-OPERATIVE DIAGNOSIS:  Cubital Tunnel Right   Carpal Tunnel Right  PROCEDURE:  Procedure(s): DECOMPRESSION POSSIBLE TRANSPOSITION ULNAR NERVE RIGHT (Right) DECOMPRESSION MEDIAN NERVE (Right)  SURGEON:  Surgeon(s) and Role:    Cindee Salt* Ruthvik Barnaby, MD - Primary  PHYSICIAN ASSISTANT:   ASSISTANTS: none   ANESTHESIA:   regional and general  EBL: none  BLOOD ADMINISTERED:none  DRAINS: none   LOCAL MEDICATIONS USED:  NONE  SPECIMEN:  No Specimen  DISPOSITION OF SPECIMEN:  N/A  COUNTS:  YES  TOURNIQUET:   Total Tourniquet Time Documented: Upper Arm (Right) - 32 minutes Total: Upper Arm (Right) - 32 minutes   DICTATION: .Other Dictation: Dictation Number [id number]  PLAN OF CARE: Discharge to home after PACU  PATIENT DISPOSITION:  PACU - hemodynamically stable.

## 2017-03-08 NOTE — Op Note (Signed)
Dictation Number [id number]

## 2017-03-08 NOTE — Anesthesia Preprocedure Evaluation (Addendum)
Anesthesia Evaluation  Patient identified by MRN, date of birth, ID band Patient awake    Reviewed: Allergy & Precautions, NPO status , Patient's Chart, lab work & pertinent test results  Airway Mallampati: III  TM Distance: >3 FB Neck ROM: Full    Dental  (+) Dental Advisory Given   Pulmonary COPD, Current Smoker,    Pulmonary exam normal  (-) decreased breath sounds(-) wheezing      Cardiovascular negative cardio ROS Normal cardiovascular exam Rhythm:Regular Rate:Normal     Neuro/Psych PSYCHIATRIC DISORDERS Anxiety Depression  Neuromuscular disease    GI/Hepatic GERD  Medicated and Controlled,(+)     substance abuse  ,   Endo/Other  negative endocrine ROS  Renal/GU negative Renal ROS     Musculoskeletal Chronic pain due to injury   Abdominal (+) + obese,   Peds  Hematology negative hematology ROS (+)   Anesthesia Other Findings   Reproductive/Obstetrics                            Anesthesia Physical  Anesthesia Plan  ASA: III  Anesthesia Plan: Regional   Post-op Pain Management: GA combined w/ Regional for post-op pain   Induction: Intravenous  PONV Risk Score and Plan: 1  Airway Management Planned: LMA  Additional Equipment:   Intra-op Plan:   Post-operative Plan: Extubation in OR  Informed Consent: I have reviewed the patients History and Physical, chart, labs and discussed the procedure including the risks, benefits and alternatives for the proposed anesthesia with the patient or authorized representative who has indicated his/her understanding and acceptance.   Dental advisory given  Plan Discussed with: CRNA  Anesthesia Plan Comments:         Anesthesia Quick Evaluation

## 2017-03-08 NOTE — Anesthesia Procedure Notes (Signed)
Anesthesia Regional Block: Supraclavicular block   Pre-Anesthetic Checklist: ,, timeout performed, Correct Patient, Correct Site, Correct Laterality, Correct Procedure,, site marked, risks and benefits discussed, Surgical consent,  Pre-op evaluation,  At surgeon's request and post-op pain management  Laterality: Right  Prep: chloraprep       Needles:  Injection technique: Single-shot  Needle Type: Echogenic Stimulator Needle     Needle Length: 9cm  Needle Gauge: 21     Additional Needles:   Procedures:, nerve stimulator,,, ultrasound used (permanent image in chart), intact distal pulses,,,  Narrative:  Start time: 03/08/2017 10:45 AM End time: 03/08/2017 10:55 AM Injection made incrementally with aspirations every 5 mL.  Performed by: Personally  Anesthesiologist: Leonides GrillsEllender, Ryan P, MD  Additional Notes: Functioning IV was confirmed and monitors were applied.  A 90mm 21ga Arrow echogenic stimulator needle was used. Sterile prep, hand hygiene and sterile gloves were used.  Negative aspiration and negative test dose prior to incremental administration of local anesthetic. The patient tolerated the procedure well.

## 2017-03-08 NOTE — Transfer of Care (Signed)
Immediate Anesthesia Transfer of Care Note  Patient: Fernando EconomyJohn Holmes  Procedure(s) Performed: DECOMPRESSION POSSIBLE TRANSPOSITION ULNAR NERVE RIGHT (Right Elbow) DECOMPRESSION MEDIAN NERVE (Right Wrist)  Patient Location: PACU  Anesthesia Type:GA combined with regional for post-op pain  Level of Consciousness: awake, alert , oriented and patient cooperative  Airway & Oxygen Therapy: Patient Spontanous Breathing and Patient connected to face mask oxygen  Post-op Assessment: Report given to RN and Post -op Vital signs reviewed and stable  Post vital signs: Reviewed and stable  Last Vitals:  Vitals:   03/08/17 1100 03/08/17 1101  BP:    Pulse: 70 71  Resp: (!) 23 (!) 23  Temp:    SpO2: 98% 99%    Last Pain:  Vitals:   03/08/17 1021  TempSrc: Oral  PainSc: 5       Patients Stated Pain Goal: 3 (03/08/17 1021)  Complications: No apparent anesthesia complications

## 2017-03-08 NOTE — Anesthesia Procedure Notes (Signed)
Procedure Name: LMA Insertion Date/Time: 03/08/2017 11:24 AM Performed by: Sheryn BisonBlocker, Rabia Argote D, CRNA Pre-anesthesia Checklist: Patient identified, Emergency Drugs available, Suction available and Patient being monitored Patient Re-evaluated:Patient Re-evaluated prior to induction Oxygen Delivery Method: Circle system utilized Preoxygenation: Pre-oxygenation with 100% oxygen Induction Type: IV induction Ventilation: Mask ventilation without difficulty LMA: LMA inserted LMA Size: 4.0 Number of attempts: 1 Airway Equipment and Method: Bite block Placement Confirmation: positive ETCO2 Tube secured with: Tape Dental Injury: Teeth and Oropharynx as per pre-operative assessment

## 2017-03-08 NOTE — Anesthesia Postprocedure Evaluation (Signed)
Anesthesia Post Note  Patient: Fernando Holmes  Procedure(s) Performed: DECOMPRESSION POSSIBLE TRANSPOSITION ULNAR NERVE RIGHT (Right Elbow) DECOMPRESSION MEDIAN NERVE (Right Wrist)     Patient location during evaluation: PACU Anesthesia Type: Regional and General Level of consciousness: awake and alert Pain management: pain level controlled Vital Signs Assessment: post-procedure vital signs reviewed and stable Respiratory status: spontaneous breathing, nonlabored ventilation, respiratory function stable and patient connected to nasal cannula oxygen Cardiovascular status: blood pressure returned to baseline and stable Postop Assessment: no apparent nausea or vomiting Anesthetic complications: no    Last Vitals:  Vitals:   03/08/17 1242 03/08/17 1313  BP:  (!) 141/84  Pulse: 80 67  Resp: 13 18  Temp:  36.7 C  SpO2: 92% 93%    Last Pain:  Vitals:   03/08/17 1313  TempSrc: Oral  PainSc: 4                  Fernando Holmes

## 2017-03-08 NOTE — H&P (Signed)
Fernando EconomyJohn Holmes is an 56 y.o. male.   Chief Complaint: numbness and pain right hand RUE:AVWUHPI:Fernando Holmes is a 56 year old right-hand-dominant male who sustained an injury to both hands when a metal break  came down on his hands which were inside as he was attempting to remove a portion of material. The injury occurred on March 07, 2015. Both hands were involved in the brake press. This occurred when his coworker accidentally hit the machine and I cycled it while his hands were still in the machine causing the machine to come down and clamped both hands. The left side was opened. This occurred through the metacarpals of each hand. [facility] was [facility] where these were managed and he was referred to Fernando. Case. He underwent pinning of the fractures on his left side. He was treated with closed splinting on his right side. The day of surgery was 02/14/15. The pins were maintained for approximately 3-4 weeks. He was then begun on therapy both hands. He is now complaining of his hands locking up. He relates a sharp aching type pain with a VAS score of 8/10. His right is not as bad as his left. He is seen with his wife and she states that he will have spasms in his left shoulder. He complains of pain going up his arm on both sides of his hand and arm on his left side. He is complaining of pain in his right side in the midportion of his hand. He has no prior history of injuries. He has no history of injury to the neck. He was seen by Fernando. Fernando Holmes he states did very little for him. He subsequently had a bone scan done showing a delayed uptake in the first metacarpal carpal joints and the second and third metacarpals on his right side. He has had nerve conductions done by Fernando. Fernando Holmes. These were performed by Fernando. Fernando Holmes on 06/17/2015 . 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 Hs being seen by Fernando. Fernando Holmes at the present time. He has had 3 stellate ganglion blocks.  He states these have not given him significant relief. He has apparently been scheduled for a spinal cord stimulator. He is now taking trazodone and gabapentin which give him minimal relief. He continues to complain of a sharp aching pain on his left side with a VAS score of 8/10. He has had a psychological evaluation in addition. This was done by Fernando. Fernando Holmes. These from Fernando. Fernando Holmes and Fernando Holmes notes are reviewed. Unfortunately we do not have his original x-rays. He is complaining of stiffness and lack of function in both hands. He is presently not working. He does smoke. He is advised to quit . He has been undergoing therapy with Fernando Holmes on his left hand for intrinsic tightness. He continues to complain of moderate pain and numbness and tingling on that side especially after any therapy. He states he is not seeing much in the way of progression at the present time with mobilization of his fingers complaints to lesser extent on his right side. He has positive nerve conductions for carpal tunnel syndrome and cubital tunnel syndrome  . He was scheduled for nerve conductions ultrasounds on the right side with Fernando Holmes. He continues to complain of pain numbness and tingling. These have been done revealing a carpal tunnel syndrome cubital tunnel syndrome with enlargement of each of the nerves ultrasound.  Past Medical History:  Diagnosis Date  . Anxiety   . Chronic pain due to injury 2016   hands crushed by hydrolic press  . Depression   . Dyspnea   . GERD (gastroesophageal reflux disease)   . Neuromuscular disorder [facility](HCC)     Past Surgical History:  Procedure Laterality Date  . HAND SURGERY Left 02/14/2015   due to injury  . KNEE ARTHROSCOPY W/ ACL RECONSTRUCTION Left 2000    History reviewed. No pertinent family history. Social History:  reports that he has been smoking cigarettes.  He has a 40.00 pack-year smoking history. he has never used smokeless tobacco. He  reports that he drinks about 1.8 oz of alcohol per week. He reports that he uses drugs. Drug: Marijuana.  Allergies: No Known Allergies  No medications prior to admission.    No results found for this or any previous visit (from the past 48 hour(s)).  No results found.   Pertinent items are noted in HPI.  Height 5\' 8"  (1.727 m), weight 95.3 kg (210 lb).  General appearance: alert, cooperative and appears stated age Head: Normocephalic, without obvious abnormality Neck: no JVD Resp: clear to auscultation bilaterally Cardio: regular rate and rhythm, S1, S2 normal, no murmur, click, rub or gallop GI: soft, non-tender; bowel sounds normal; no masses,  no organomegaly Extremities: nubness and pain right hand Pulses: 2+ and symmetric Skin: Skin color, texture, turgor normal. No rashes or lesions Neurologic: Grossly normal Incision/Wound: na  Assessment/Plan Assessment:  1. Crush injury of hand, right,  2. Multiple fractures of metacarpal bone, open,3. Contracture of finger joint, left  4. Bilateral carpal tunnel syndrome  5. 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 We recommend release of his median and ulnar nerve at his elbow with possible transposition of the ulnar nerve on the right side. Will defer any release of the intrinsics at the present time. We will do this to see if he can get by with the releases were to help with his pain numbness and tingling. We will continue his therapy with Fernando Holmes in the interim. Pre-peri-and postoperative course are discussed along with risk applications. Is where there is no guarantee to the surgery the possibility of infection recurrence injury to arteries nerves tendons incomplete release symptoms dystrophy. Scheduled for rightcarpal tunnel cubital tunnel decompression possible transposition as an outpatient under regional anesthesia the left side has been done in  the past.      Fernando Holmes R 03/08/2017, 8:29 AM

## 2017-03-08 NOTE — Op Note (Signed)
NAME:  MATEUSZ, NEILAN NO.:  [id number]  MEDICAL RECORD NO.:  [id number]  LOCATION:                                 FACILITY:  PHYSICIAN:  Cindee Salt, M.D.            DATE OF BIRTH:  DATE OF PROCEDURE:  03/08/2017 DATE OF DISCHARGE:                              OPERATIVE REPORT   PREOPERATIVE DIAGNOSIS:  Cubital tunnel carpal tunnel syndrome, right arm.  POSTOPERATIVE DIAGNOSIS:  Cubital tunnel carpal tunnel syndrome, right arm.  OPERATION:  Decompression of median nerve at the wrist, decompression of ulnar nerve at the elbow.  SURGEON:  Cindee Salt, MD.  ASSISTANT:  None.  ANESTHESIA:  Supraclavicular block, general.  PLACE OF SURGERY:  [facility].  HISTORY:  The patient is a 56 year old male who sustained a crush injury to both hands with multiple fractures.  He has had pain, numbness, and tingling in both hands since that time.  He has undergone decompression of the median nerve and ulnar nerve on his left side with positive nerve conductions on each side.  He is admitted now for decompression, possible transposition of the ulnar nerve on right side and carpal tunnel syndrome on right side.  Pre, peri, and postoperative courses have been discussed along with risks and complications.  He is aware that there is no guarantee to the surgery, the possibility of infection, recurrence of injury to arteries, nerves, and tendons, incomplete relief of symptoms, and dystrophy.  In the preoperative area, the patient is seen, the extremity marked by both patient and surgeon, antibiotic given.  DESCRIPTION OF PROCEDURE:  The patient was brought to the operating room after a supraclavicular block was carried out in the preoperative area under the direction of the Anesthesia Department.  He was prepped using ChloraPrep in supine position with the right arm free.  A 3-minute dry time was allowed and time-out taken confirming the patient and  procedure after the ChloraPrep was done.  The limb was exsanguinated.  He had feeling in the arm, but complaining of pain.  A general anesthetic was then carried out.  A longitudinal incision was made in the right palm after inflation of the tourniquet to 250 mmHg.  This was carried down through subcutaneous tissue.  Bleeders were electrocauterized with bipolar.  The palmar fascia was split.  The superficial palmar arch was identified along with the flexor tendon of the ring finger.  Retractors were placed retracting the flexor tendons, median nerve radially, the ulnar nerve ulnarly.  The flexor retinaculum was then incised on its ulnar border.  A right angle and Sewell retractor were placed between skin and forearm fascia.  The deep structures were dissected free from the distal forearm fascia with blunt scissors and then the distal forearm fascia released for approximately 2-3 cm under direct vision. The canal was explored.  The motor branch entered into muscle distally. The air compression to the nerve was apparent.  No further lesions were identified.  The wound was irrigated with saline.  The wound was then closed with interrupted 4-0 nylon sutures.  A separate incision was then made over the medial epicondyle  of the right elbow.  This was carried down through subcutaneous tissue.  Bleeders were again electrocauterized with bipolar.  The posterior branches of the medial antebrachial cutaneous nerve of the forearm were identified and protected.  The dissection was carried down to the ulnar nerve.  Osborne's fascia was released on its posterior aspect.  The nerve was then dissected to the leash of the flexor carpi ulnaris.  The subcutaneous tissue was dissected free from the flexor carpi ulnaris and a knee __________ retractor placed.  The superficial fascia was then bluntly dissected free.  The muscle belly was split.  A KMI guide for carpal tunnel release was then placed between the  distal ulnar nerve and the deep fascia, and using angled ENT straight scissors, the fascia was released distally for approximately 10 cm.  Attention was then directed proximally.  The subcutaneous tissue was dissected free from the brachial fascia proximally.  The knee retractor was placed.  The [facility] guide was placed between the ulnar nerve proximally and the brachial fascia and the angled NT scissors were then used with KMI guide to dissect the fascia free from the ulnar nerve proximally.  The elbow was fully flexed.  No subluxation was noted.  The wound was copiously irrigated with saline.  The limb of Osborne's fascia was then sutured to the posterior skin flap with 2-0 Vicryl sutures.  The subcutaneous tissue was closed with interrupted 4-0 Vicryl and the skin with interrupted 4-0 nylon sutures.  A sterile compressive dressing, long arm in nature, was then applied.  On deflation of the tourniquet, all fingers immediately pinked.  He was taken to the recovery room for observation in satisfactory condition.  He will be discharged to home [place] [facility] [place] on [place].          ______________________________ Cindee SaltGary Terrie Haring, M.D.     GK/MEDQ  D:  03/08/2017  T:  03/08/2017  Job:  [id number]

## 2017-03-08 NOTE — Anesthesia Procedure Notes (Signed)
Procedure Name: MAC Date/Time: 03/08/2017 11:00 AM Performed by: Signe Colt, CRNA Pre-anesthesia Checklist: Patient identified, Emergency Drugs available, Suction available, Patient being monitored and Timeout performed Patient Re-evaluated:Patient Re-evaluated prior to induction Oxygen Delivery Method: Simple face mask

## 2017-03-08 NOTE — Discharge Instructions (Addendum)

## 2017-03-09 ENCOUNTER — Encounter (HOSPITAL_BASED_OUTPATIENT_CLINIC_OR_DEPARTMENT_OTHER): Payer: Self-pay | Admitting: Orthopedic Surgery

## 2017-03-17 ENCOUNTER — Encounter (HOSPITAL_BASED_OUTPATIENT_CLINIC_OR_DEPARTMENT_OTHER): Payer: Self-pay | Admitting: Orthopedic Surgery

## 2017-08-28 IMAGING — NM NM BONE 3 PHASE
1 series · 6 of 6 positions shown · non-contrast
Comparison: None.

CLINICAL DATA: Bilateral hand trauma in January 2014

EXAM:
NUCLEAR MEDICINE 3-PHASE BONE SCAN
TECHNIQUE: Radionuclide angiographic images, immediate static blood pool
images, and 3-hour delayed static images were obtained of the hand
after intravenous injection of radiopharmaceutical.
RADIOPHARMACEUTICALS:  26.3 mCi Uc-88m MDP

[bf bone flow · 10.03mm/px · 6 of 48 frames shown]
[frame 5/48]
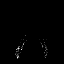
[frame 13/48]
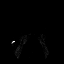
[frame 21/48]
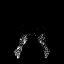
[frame 29/48]
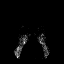
[frame 37/48]
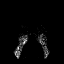
[frame 45/48]
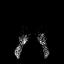

[6 of 6 positions shown; findings below may reference images not displayed]

FINDINGS: Vascular phase: No asymmetric or increased blood flow to LEFT or
RIGHT hand.

Blood pool phase: Symmetric blood pool accumulation at the first
carpal/metacarpal joint as well as the third metacarpal phalangeal
joint on the LEFT and the second metacarpal phalangeal joint on the
RIGHT appear

Delayed phase: Abnormal delayed phase radiotracer accumulation
matches the blood pool activity with uptake at the bilateral first
carpal metacarpal joints as well as the bilateral second and third
metacarpal phalangeal joints.
IMPRESSION: Abnormal blood pool and delayed imaging uptake in the first
carpal/metacarpal joint as well as the second and third metacarpal
phalangeal joints suggest arthropathy. Unusual pattern for trauma.
No hyperemia to suggest complex region pain syndrome.

## 2019-07-05 ENCOUNTER — Encounter: Payer: Self-pay | Admitting: Cardiovascular Disease

## 2019-07-05 DIAGNOSIS — F17210 Nicotine dependence, cigarettes, uncomplicated: Secondary | ICD-10-CM | POA: Diagnosis not present

## 2019-07-05 DIAGNOSIS — R079 Chest pain, unspecified: Secondary | ICD-10-CM | POA: Diagnosis not present

## 2019-07-05 DIAGNOSIS — J439 Emphysema, unspecified: Secondary | ICD-10-CM | POA: Diagnosis not present

## 2019-07-05 DIAGNOSIS — S6720XA Crushing injury of unspecified hand, initial encounter: Secondary | ICD-10-CM | POA: Diagnosis not present

## 2019-07-05 DIAGNOSIS — Z6832 Body mass index (BMI) 32.0-32.9, adult: Secondary | ICD-10-CM | POA: Diagnosis not present

## 2019-07-05 DIAGNOSIS — K429 Umbilical hernia without obstruction or gangrene: Secondary | ICD-10-CM | POA: Diagnosis not present

## 2019-07-05 DIAGNOSIS — F329 Major depressive disorder, single episode, unspecified: Secondary | ICD-10-CM | POA: Diagnosis not present

## 2019-07-05 DIAGNOSIS — I4891 Unspecified atrial fibrillation: Secondary | ICD-10-CM | POA: Diagnosis not present

## 2019-07-05 DIAGNOSIS — J039 Acute tonsillitis, unspecified: Secondary | ICD-10-CM | POA: Diagnosis not present

## 2019-07-05 DIAGNOSIS — Z77090 Contact with and (suspected) exposure to asbestos: Secondary | ICD-10-CM | POA: Diagnosis not present

## 2019-07-13 ENCOUNTER — Encounter: Payer: Self-pay | Admitting: Cardiovascular Disease

## 2019-07-13 DIAGNOSIS — R0602 Shortness of breath: Secondary | ICD-10-CM | POA: Diagnosis not present

## 2019-07-13 DIAGNOSIS — I4891 Unspecified atrial fibrillation: Secondary | ICD-10-CM | POA: Diagnosis not present

## 2019-07-13 DIAGNOSIS — R0600 Dyspnea, unspecified: Secondary | ICD-10-CM | POA: Diagnosis not present

## 2019-07-13 DIAGNOSIS — R079 Chest pain, unspecified: Secondary | ICD-10-CM | POA: Diagnosis not present

## 2019-07-19 DIAGNOSIS — Z77090 Contact with and (suspected) exposure to asbestos: Secondary | ICD-10-CM | POA: Diagnosis not present

## 2019-07-19 DIAGNOSIS — F1210 Cannabis abuse, uncomplicated: Secondary | ICD-10-CM | POA: Diagnosis not present

## 2019-07-19 DIAGNOSIS — R079 Chest pain, unspecified: Secondary | ICD-10-CM | POA: Diagnosis not present

## 2019-07-19 DIAGNOSIS — J439 Emphysema, unspecified: Secondary | ICD-10-CM | POA: Diagnosis not present

## 2019-07-19 DIAGNOSIS — F329 Major depressive disorder, single episode, unspecified: Secondary | ICD-10-CM | POA: Diagnosis not present

## 2019-07-19 DIAGNOSIS — I4891 Unspecified atrial fibrillation: Secondary | ICD-10-CM | POA: Diagnosis not present

## 2019-07-19 DIAGNOSIS — G629 Polyneuropathy, unspecified: Secondary | ICD-10-CM | POA: Diagnosis not present

## 2019-07-19 DIAGNOSIS — S6720XA Crushing injury of unspecified hand, initial encounter: Secondary | ICD-10-CM | POA: Diagnosis not present

## 2019-08-08 ENCOUNTER — Encounter: Payer: Self-pay | Admitting: *Deleted

## 2019-08-09 ENCOUNTER — Other Ambulatory Visit: Payer: Self-pay

## 2019-08-09 ENCOUNTER — Ambulatory Visit (INDEPENDENT_AMBULATORY_CARE_PROVIDER_SITE_OTHER): Payer: Medicare HMO | Admitting: Cardiovascular Disease

## 2019-08-09 ENCOUNTER — Encounter: Payer: Self-pay | Admitting: Cardiovascular Disease

## 2019-08-09 ENCOUNTER — Other Ambulatory Visit: Payer: Self-pay | Admitting: Cardiovascular Disease

## 2019-08-09 ENCOUNTER — Encounter: Payer: Self-pay | Admitting: *Deleted

## 2019-08-09 VITALS — BP 158/84 | HR 104 | Ht 67.0 in | Wt 209.6 lb

## 2019-08-09 DIAGNOSIS — I10 Essential (primary) hypertension: Secondary | ICD-10-CM | POA: Diagnosis not present

## 2019-08-09 DIAGNOSIS — R9439 Abnormal result of other cardiovascular function study: Secondary | ICD-10-CM

## 2019-08-09 DIAGNOSIS — I4891 Unspecified atrial fibrillation: Secondary | ICD-10-CM | POA: Diagnosis not present

## 2019-08-09 DIAGNOSIS — J449 Chronic obstructive pulmonary disease, unspecified: Secondary | ICD-10-CM | POA: Diagnosis not present

## 2019-08-09 DIAGNOSIS — R079 Chest pain, unspecified: Secondary | ICD-10-CM | POA: Diagnosis not present

## 2019-08-09 DIAGNOSIS — Z720 Tobacco use: Secondary | ICD-10-CM

## 2019-08-09 MED ORDER — NITROGLYCERIN 0.4 MG SL SUBL: 0.4000 mg | SUBLINGUAL_TABLET | SUBLINGUAL | 3 refills | Status: AC | PRN

## 2019-08-09 MED ORDER — RIVAROXABAN 20 MG PO TABS: 20.0000 mg | ORAL_TABLET | Freq: Every day | ORAL | 0 refills | Status: DC

## 2019-08-09 MED ORDER — RIVAROXABAN 20 MG PO TABS: 20.0000 mg | ORAL_TABLET | Freq: Every day | ORAL | 6 refills | Status: DC

## 2019-08-09 MED ORDER — METOPROLOL SUCCINATE ER 50 MG PO TB24: 75.0000 mg | ORAL_TABLET | Freq: Every day | ORAL | 1 refills | Status: AC

## 2019-08-09 MED ORDER — SODIUM CHLORIDE 0.9% FLUSH: 3.0000 mL | Freq: Two times a day (BID) | INTRAVENOUS | Status: DC

## 2019-08-09 MED ORDER — ATORVASTATIN CALCIUM 40 MG PO TABS: 40.0000 mg | ORAL_TABLET | Freq: Every day | ORAL | 1 refills | Status: AC

## 2019-08-09 NOTE — Patient Instructions (Addendum)
Your physician wants you to follow-up in: [place] [facility]   Your physician has recommended you make the following change in your medication:   STOP ASPIRIN   START XARELTO 20 MG DAILY   START LIPITOR 40 MG DAILY  INCREASE TOPROL XL 75 MG DAILY - 1 AND 1/2 TABLETS DAILY   TAKE NITROGLYCERIN 0.4 MG AS NEEDED FOR CHEST PAIN   Your physician has requested that you have a cardiac catheterization. Cardiac catheterization is used to diagnose and/or treat various heart conditions. Doctors may recommend this procedure for a number of different reasons. The most common reason is to evaluate chest pain. Chest pain can be a symptom of coronary artery disease (CAD), and cardiac catheterization can show whether plaque is narrowing or blocking your heart's arteries. This procedure is also used to evaluate the valves, as well as measure the blood flow and oxygen levels in different parts of your heart. For further information please visit [email or web address]. Please follow instruction sheet, as given.    Nitroglycerin sublingual tablets What is this medicine? NITROGLYCERIN (nye troe GLI ser in) is a type of vasodilator. It relaxes blood vessels, increasing the blood and oxygen supply to your heart. This medicine is used to relieve chest pain caused by angina. It is also used to prevent chest pain before activities like climbing stairs, going outdoors in cold weather, or sexual activity. This medicine may be used for other purposes; ask your health care provider or pharmacist if you have questions. COMMON BRAND NAME(S): Nitroquick, Nitrostat, Nitrotab What should I tell my health care provider before I take this medicine? They need to know if you have any of these conditions:  anemia  head injury, recent stroke, or bleeding in the brain  liver disease  previous heart attack  an unusual or allergic reaction to nitroglycerin, other medicines, foods, dyes, or  preservatives  pregnant or trying to get pregnant  breast-feeding How should I use this medicine? Take this medicine by mouth as needed. At the first sign of an angina attack (chest pain or tightness) place one tablet under your tongue. You can also take this medicine 5 to 10 minutes before an event likely to produce chest pain. Follow the directions on the prescription label. Let the tablet dissolve under the tongue. Do not swallow whole. Replace the dose if you accidentally swallow it. It will help if your mouth is not dry. Saliva around the tablet will help it to dissolve more quickly. Do not eat or drink, smoke or chew tobacco while a tablet is dissolving. If you are not better within 5 minutes after taking ONE dose of nitroglycerin, call 9-1-1 immediately to seek emergency medical care. Do not take more than 3 nitroglycerin tablets over 15 minutes. If you take this medicine often to relieve symptoms of angina, your doctor or health care professional may provide you with different instructions to manage your symptoms. If symptoms do not go away after following these instructions, it is important to call 9-1-1 immediately. Do not take more than 3 nitroglycerin tablets over 15 minutes. Talk to your pediatrician regarding the use of this medicine in children. Special care may be needed. Overdosage: If you think you have taken too much of this medicine contact a poison control center or emergency room at once. NOTE: This medicine is only for you. Do not share this medicine with others. What if I miss a dose? This does not apply. This medicine is only used as needed. What  may interact with this medicine? Do not take this medicine with any of the following medications:  certain migraine medicines like ergotamine and dihydroergotamine (DHE)  medicines used to treat erectile dysfunction like sildenafil, tadalafil, and vardenafil  riociguat This medicine may also interact with the following  medications:  alteplase  aspirin  heparin  medicines for high blood pressure  medicines for mental depression  other medicines used to treat angina  phenothiazines like chlorpromazine, mesoridazine, prochlorperazine, thioridazine This list may not describe all possible interactions. Give your health care provider a list of all the medicines, herbs, non-prescription drugs, or dietary supplements you use. Also tell them if you smoke, drink alcohol, or use illegal drugs. Some items may interact with your medicine. What should I watch for while using this medicine? Tell your doctor or health care professional if you feel your medicine is no longer working. Keep this medicine with you at all times. Sit or lie down when you take your medicine to prevent falling if you feel dizzy or faint after using it. Try to remain calm. This will help you to feel better faster. If you feel dizzy, take several deep breaths and lie down with your feet propped up, or bend forward with your head resting between your knees. You may get drowsy or dizzy. Do not drive, use machinery, or do anything that needs mental alertness until you know how this drug affects you. Do not stand or sit up quickly, especially if you are an older patient. This reduces the risk of dizzy or fainting spells. Alcohol can make you more drowsy and dizzy. Avoid alcoholic drinks. Do not treat yourself for coughs, colds, or pain while you are taking this medicine without asking your doctor or health care professional for advice. Some ingredients may increase your blood pressure. What side effects may I notice from receiving this medicine? Side effects that you should report to your doctor or health care professional as soon as possible:  blurred vision  dry mouth  skin rash  sweating  the feeling of extreme pressure in the head  unusually weak or tired Side effects that usually do not require medical attention (report to your doctor or  health care professional if they continue or are bothersome):  flushing of the face or neck  headache  irregular heartbeat, palpitations  nausea, vomiting This list may not describe all possible side effects. Call your doctor for medical advice about side effects. You may report side effects to FDA at 1-800-FDA-1088. Where should I keep my medicine? Keep out of the reach of children. Store at room temperature between 20 and 25 degrees C (68 and 77 degrees F). Store in Retail buyer. Protect from light and moisture. Keep tightly closed. Throw away any unused medicine after the expiration date. NOTE: This sheet is a summary. It may not cover all possible information. If you have questions about this medicine, talk to your doctor, pharmacist, or health care provider.  2020 Elsevier/Gold Standard (2013-02-15 17:57:36)

## 2019-08-09 NOTE — Progress Notes (Signed)
CARDIOLOGY CONSULT NOTE  Patient ID: Fernando Holmes MRN: [id number] DOB/AGE: 1960/06/22 59 y.o.  Admit date: (Not on file) Primary Physician: Curlene Labrum, MD  Reason for Consultation: Chest pain and abnormal stress test  HPI: Fernando Holmes is a 59 y.o. male who is being seen today for the evaluation of chest pain and abnormal stress test at the request of [name], [name], MD.   I reviewed extensive records from his PCP.  It appears he sustained injuries to both hands as they were crushed in a work accident in October 2016 and he has had multiple surgeries on both hands.  He also has chronic back pain.  He is diagnosed with atrial fibrillation at his office visit with PCP on 07/19/2019.  An echocardiogram and stress test were ordered at that time.  Echocardiogram on 07/13/2019 demonstrated normal LV systolic function, EF 50 to 55%, mild concentric LVH, and moderate left atrial dilatation and mild right atrial dilatation.  He underwent a nuclear stress test on 07/13/2019 which showed a moderate sized fixed inferior defect consistent with old infarction with moderate reversibility of the inferolateral myocardium concerning for ischemia.  I reviewed an ECG tracing performed on 07/05/2019 which was reported as "atrial fibrillation, septal myocardial infarction ".   He is here with his son, Fernando Holmes.  Fernando Holmes lives in [place] and works in [place].  The patient has not seen a doctor in over 30 years until he recently established with a PCP.  He has diffuse joint pains particularly in both hands as well as his left arm and shoulder along with left groin and left leg pain.  Has difficulty standing up.  He become short of breath when lying down.  He has had significant left-sided chest pains which can occur both at rest and with exertion.  They radiate down the left arm.  He has been smoking over a pack of cigarettes daily for over 40 years.  I personally reviewed the ECG  performed today which demonstrates rapid atrial fibrillation, 109 bpm.  Family history: Father had 3 or 4 coronary artery stents and eventually underwent bypass surgery.  His for stent was between the ages of 69 and 41.  No Known Allergies  Current Outpatient Medications  Medication Sig Dispense Refill  . albuterol (VENTOLIN HFA) 108 (90 Base) MCG/ACT inhaler Inhale 1-2 puffs into the lungs every 4 (four) hours as needed.    . DULoxetine (CYMBALTA) 60 MG capsule Take 1 capsule by mouth daily.    . metoprolol succinate (TOPROL-XL) 50 MG 24 hr tablet Take 1.5 tablets (75 mg total) by mouth daily. 135 tablet 1  . SPIRIVA HANDIHALER 18 MCG inhalation capsule Place 1 capsule into inhaler and inhale daily.    Fernando Holmes atorvastatin (LIPITOR) 40 MG tablet Take 1 tablet (40 mg total) by mouth daily. 90 tablet 1  . nitroGLYCERIN (NITROSTAT) 0.4 MG SL tablet Place 1 tablet (0.4 mg total) under the tongue every 5 (five) minutes as needed for chest pain. 25 tablet 3  . rivaroxaban (XARELTO) 20 MG TABS tablet Take 1 tablet (20 mg total) by mouth daily with supper. 28 tablet 0   No current facility-administered medications for this visit.    Past Medical History:  Diagnosis Date  . Anxiety   . Chronic pain due to injury 2016   hands crushed by hydrolic press  . Depression   . Dyspnea   . GERD (gastroesophageal reflux disease)   . Neuromuscular disorder ([place])  Past Surgical History:  Procedure Laterality Date  . CARPAL TUNNEL RELEASE Left 07/06/2016   Procedure: CARPAL TUNNEL RELEASE;  Surgeon: Fernando Salt, MD;  Location: [facility];  Service: Orthopedics;  Laterality: Left;  . CARPAL TUNNEL RELEASE Right 03/08/2017   Procedure: DECOMPRESSION MEDIAN NERVE;  Surgeon: Fernando Salt, MD;  Location: [facility];  Service: Orthopedics;  Laterality: Right;  . HAND SURGERY Left 02/14/2015   due to injury  . KNEE ARTHROSCOPY W/ ACL RECONSTRUCTION Left 2000  . ULNAR NERVE  TRANSPOSITION Left 07/06/2016   Procedure: LEFT ULNAR NERVE DECOMPRESSION,;  Surgeon: Fernando Salt, MD;  Location: [facility];  Service: Orthopedics;  Laterality: Left;  . ULNAR NERVE TRANSPOSITION Right 03/08/2017   Procedure: DECOMPRESSION ULNAR NERVE RIGHT;  Surgeon: Fernando Salt, MD;  Location: [facility];  Service: Orthopedics;  Laterality: Right;    Social History   Socioeconomic History  . Marital status: Legally Separated    Spouse name: Not on file  . Number of children: Not on file  . Years of education: Not on file  . Highest education level: Not on file  Occupational History  . Not on file  Tobacco Use  . Smoking status: Current Every Day Smoker    Packs/day: 1.00    Years: 40.00    Pack years: 40.00    Types: Cigarettes    Start date: 02/27/1971  . Smokeless tobacco: Never Used  Substance and Sexual Activity  . Alcohol use: Yes    Alcohol/week: 3.0 standard drinks    Types: 3 Cans of beer per week    Comment: daily  . Drug use: Yes    Types: Marijuana    Comment: none for last several months  . Sexual activity: Not on file  Other Topics Concern  . Not on file  Social History Narrative  . Not on file   Social Determinants of Health   Financial Resource Strain:   . Difficulty of Paying Living Expenses:   Food Insecurity:   . Worried About Programme researcher, broadcasting/film/video in the Last Year:   . Barista in the Last Year:   Transportation Needs:   . Freight forwarder (Medical):   Fernando Holmes Lack of Transportation (Non-Medical):   Physical Activity:   . Days of Exercise per Week:   . Minutes of Exercise per Session:   Stress:   . Feeling of Stress :   Social Connections:   . Frequency of Communication with Friends and Family:   . Frequency of Social Gatherings with Friends and Family:   . Attends Religious Services:   . Active Member of Clubs or Organizations:   . Attends Banker Meetings:   Fernando Holmes Marital Status:   Intimate  Partner Violence:   . Fear of Current or Ex-Partner:   . Emotionally Abused:   Fernando Holmes Physically Abused:   . Sexually Abused:       Current Meds  Medication Sig  . albuterol (VENTOLIN HFA) 108 (90 Base) MCG/ACT inhaler Inhale 1-2 puffs into the lungs every 4 (four) hours as needed.  . DULoxetine (CYMBALTA) 60 MG capsule Take 1 capsule by mouth daily.  . metoprolol succinate (TOPROL-XL) 50 MG 24 hr tablet Take 1.5 tablets (75 mg total) by mouth daily.  Fernando Holmes SPIRIVA HANDIHALER 18 MCG inhalation capsule Place 1 capsule into inhaler and inhale daily.  . [DISCONTINUED] ASPIRIN LOW DOSE 81 MG EC tablet Take 81 mg by mouth daily.  . [  DISCONTINUED] metoprolol succinate (TOPROL-XL) 50 MG 24 hr tablet Take 50 mg by mouth daily.      Review of systems complete and found to be negative unless listed above in HPI    Physical exam Blood pressure (!) 158/84, pulse (!) 104, height 5\' 7"  (1.702 m), weight 209 lb 9.6 oz (95.1 kg), SpO2 91 %. General: Obese male in NAD Neck: No JVD, no thyromegaly or thyroid nodule.  Lungs: Diffusely diminished breath sounds with bilateral faint inspiratory and expiratory wheezes. CV: Tachycardic, irregular rhythm, normal S1/S2, no S3, no murmur.  Trace bilateral lower extremity edema.  No carotid bruit.    Abdomen: Soft, nontender, no distention.  Skin: Intact without lesions or rashes.  Neurologic: Alert and oriented x 3.  Psych: Normal affect. Extremities: No clubbing or cyanosis.  HEENT: Normal.   ECG: Most recent ECG reviewed.   Labs: Lab Results  Component Value Date/Time   K 4.6 02/14/2013 12:39 PM   BUN 9 02/14/2013 12:39 PM   CREATININE 0.83 02/14/2013 12:39 PM   HGB 15.3 02/14/2013 12:39 PM     Lipids: No results found for: LDLCALC, LDLDIRECT, CHOL, TRIG, HDL      ASSESSMENT AND PLAN:   1.  Chest pain/abnormal nuclear stress test: He underwent a nuclear stress test on 07/13/2019 which showed a moderate sized fixed inferior defect consistent with  old infarction with moderate reversibility of the inferolateral myocardium concerning for ischemia.  LVEF 50-55% by echocardiogram in March 2021.  He is on aspirin and Toprol-XL 50 mg daily.  I will increase Toprol-XL to 75 mg daily as he has rapid atrial fibrillation.  I will start atorvastatin 40 mg.  I will stop aspirin and start Xarelto 20 mg daily.  I will obtain a copy of labs from PCP.  I will arrange for cardiac catheterization. Risks and benefits of cardiac catheterization have been discussed with the patient.  These include bleeding, infection, kidney damage, stroke, heart attack, death.  The patient understands these risks and is willing to proceed.  2.  Hypertension: Blood pressure is elevated.  He is on Toprol-XL 50 mg daily.  I will increase to 75 mg daily.  3.  Tobacco abuse: He is smoked a pack of cigarettes daily for over 40 years.  He needs cessation.  I began to provide cessation counseling but he preferred not to talk about it at this time.  4.  Rapid atrial fibrillation: I will increase Toprol-XL to 75 mg daily.  I will stop aspirin and start Xarelto 20 mg daily.  5.  COPD: He needs tobacco cessation.  He likely needs a referral to a pulmonologist as he will need spirometry and optimization of medical management.    Disposition: Follow up in 1 month  Signed: April 2021, M.D., F.A.C.C.  08/09/2019, 4:17 PM

## 2019-08-09 NOTE — H&P (View-Only) (Signed)
CARDIOLOGY CONSULT NOTE  Patient ID: Fernando Holmes MRN: [id number] DOB/AGE: 1960/06/22 59 y.o.  Admit date: (Not on file) Primary Physician: Curlene Labrum, MD  Reason for Consultation: Chest pain and abnormal stress test  HPI: Fernando Holmes is a 59 y.o. male who is being seen today for the evaluation of chest pain and abnormal stress test at the request of [name], [name], MD.   I reviewed extensive records from his PCP.  It appears he sustained injuries to both hands as they were crushed in a work accident in October 2016 and he has had multiple surgeries on both hands.  He also has chronic back pain.  He is diagnosed with atrial fibrillation at his office visit with PCP on 07/19/2019.  An echocardiogram and stress test were ordered at that time.  Echocardiogram on 07/13/2019 demonstrated normal LV systolic function, EF 50 to 55%, mild concentric LVH, and moderate left atrial dilatation and mild right atrial dilatation.  He underwent a nuclear stress test on 07/13/2019 which showed a moderate sized fixed inferior defect consistent with old infarction with moderate reversibility of the inferolateral myocardium concerning for ischemia.  I reviewed an ECG tracing performed on 07/05/2019 which was reported as "atrial fibrillation, septal myocardial infarction ".   He is here with his son, [name].  [name] lives in [place] and works in [place].  The patient has not seen a doctor in over 30 years until he recently established with a PCP.  He has diffuse joint pains particularly in both hands as well as his left arm and shoulder along with left groin and left leg pain.  Has difficulty standing up.  He become short of breath when lying down.  He has had significant left-sided chest pains which can occur both at rest and with exertion.  They radiate down the left arm.  He has been smoking over a pack of cigarettes daily for over 40 years.  I personally reviewed the ECG  performed today which demonstrates rapid atrial fibrillation, 109 bpm.  Family history: Father had 3 or 4 coronary artery stents and eventually underwent bypass surgery.  His for stent was between the ages of 69 and 41.  No Known Allergies  Current Outpatient Medications  Medication Sig Dispense Refill  . albuterol (VENTOLIN HFA) 108 (90 Base) MCG/ACT inhaler Inhale 1-2 puffs into the lungs every 4 (four) hours as needed.    . DULoxetine (CYMBALTA) 60 MG capsule Take 1 capsule by mouth daily.    . metoprolol succinate (TOPROL-XL) 50 MG 24 hr tablet Take 1.5 tablets (75 mg total) by mouth daily. 135 tablet 1  . SPIRIVA HANDIHALER 18 MCG inhalation capsule Place 1 capsule into inhaler and inhale daily.    Marland Kitchen atorvastatin (LIPITOR) 40 MG tablet Take 1 tablet (40 mg total) by mouth daily. 90 tablet 1  . nitroGLYCERIN (NITROSTAT) 0.4 MG SL tablet Place 1 tablet (0.4 mg total) under the tongue every 5 (five) minutes as needed for chest pain. 25 tablet 3  . rivaroxaban (XARELTO) 20 MG TABS tablet Take 1 tablet (20 mg total) by mouth daily with supper. 28 tablet 0   No current facility-administered medications for this visit.    Past Medical History:  Diagnosis Date  . Anxiety   . Chronic pain due to injury 2016   hands crushed by hydrolic press  . Depression   . Dyspnea   . GERD (gastroesophageal reflux disease)   . Neuromuscular disorder ([place])  Past Surgical History:  Procedure Laterality Date  . CARPAL TUNNEL RELEASE Left 07/06/2016   Procedure: CARPAL TUNNEL RELEASE;  Surgeon: Cindee Salt, MD;  Location: [facility];  Service: Orthopedics;  Laterality: Left;  . CARPAL TUNNEL RELEASE Right 03/08/2017   Procedure: DECOMPRESSION MEDIAN NERVE;  Surgeon: Cindee Salt, MD;  Location: [facility];  Service: Orthopedics;  Laterality: Right;  . HAND SURGERY Left 02/14/2015   due to injury  . KNEE ARTHROSCOPY W/ ACL RECONSTRUCTION Left 2000  . ULNAR NERVE  TRANSPOSITION Left 07/06/2016   Procedure: LEFT ULNAR NERVE DECOMPRESSION,;  Surgeon: Cindee Salt, MD;  Location: [facility];  Service: Orthopedics;  Laterality: Left;  . ULNAR NERVE TRANSPOSITION Right 03/08/2017   Procedure: DECOMPRESSION ULNAR NERVE RIGHT;  Surgeon: Cindee Salt, MD;  Location: [facility];  Service: Orthopedics;  Laterality: Right;    Social History   Socioeconomic History  . Marital status: Legally Separated    Spouse name: Not on file  . Number of children: Not on file  . Years of education: Not on file  . Highest education level: Not on file  Occupational History  . Not on file  Tobacco Use  . Smoking status: Current Every Day Smoker    Packs/day: 1.00    Years: 40.00    Pack years: 40.00    Types: Cigarettes    Start date: 02/27/1971  . Smokeless tobacco: Never Used  Substance and Sexual Activity  . Alcohol use: Yes    Alcohol/week: 3.0 standard drinks    Types: 3 Cans of beer per week    Comment: daily  . Drug use: Yes    Types: Marijuana    Comment: none for last several months  . Sexual activity: Not on file  Other Topics Concern  . Not on file  Social History Narrative  . Not on file   Social Determinants of Health   Financial Resource Strain:   . Difficulty of Paying Living Expenses:   Food Insecurity:   . Worried About Programme researcher, broadcasting/film/video in the Last Year:   . Barista in the Last Year:   Transportation Needs:   . Freight forwarder (Medical):   Marland Kitchen Lack of Transportation (Non-Medical):   Physical Activity:   . Days of Exercise per Week:   . Minutes of Exercise per Session:   Stress:   . Feeling of Stress :   Social Connections:   . Frequency of Communication with Friends and Family:   . Frequency of Social Gatherings with Friends and Family:   . Attends Religious Services:   . Active Member of Clubs or Organizations:   . Attends Banker Meetings:   Marland Kitchen Marital Status:   Intimate  Partner Violence:   . Fear of Current or Ex-Partner:   . Emotionally Abused:   Marland Kitchen Physically Abused:   . Sexually Abused:       Current Meds  Medication Sig  . albuterol (VENTOLIN HFA) 108 (90 Base) MCG/ACT inhaler Inhale 1-2 puffs into the lungs every 4 (four) hours as needed.  . DULoxetine (CYMBALTA) 60 MG capsule Take 1 capsule by mouth daily.  . metoprolol succinate (TOPROL-XL) 50 MG 24 hr tablet Take 1.5 tablets (75 mg total) by mouth daily.  Marland Kitchen SPIRIVA HANDIHALER 18 MCG inhalation capsule Place 1 capsule into inhaler and inhale daily.  . [DISCONTINUED] ASPIRIN LOW DOSE 81 MG EC tablet Take 81 mg by mouth daily.  . [  DISCONTINUED] metoprolol succinate (TOPROL-XL) 50 MG 24 hr tablet Take 50 mg by mouth daily.      Review of systems complete and found to be negative unless listed above in HPI    Physical exam Blood pressure (!) 158/84, pulse (!) 104, height 5' 7" (1.702 m), weight 209 lb 9.6 oz (95.1 kg), SpO2 91 %. General: Obese male in NAD Neck: No JVD, no thyromegaly or thyroid nodule.  Lungs: Diffusely diminished breath sounds with bilateral faint inspiratory and expiratory wheezes. CV: Tachycardic, irregular rhythm, normal S1/S2, no S3, no murmur.  Trace bilateral lower extremity edema.  No carotid bruit.    Abdomen: Soft, nontender, no distention.  Skin: Intact without lesions or rashes.  Neurologic: Alert and oriented x 3.  Psych: Normal affect. Extremities: No clubbing or cyanosis.  HEENT: Normal.   ECG: Most recent ECG reviewed.   Labs: Lab Results  Component Value Date/Time   K 4.6 02/14/2013 12:39 PM   BUN 9 02/14/2013 12:39 PM   CREATININE 0.83 02/14/2013 12:39 PM   HGB 15.3 02/14/2013 12:39 PM     Lipids: No results found for: LDLCALC, LDLDIRECT, CHOL, TRIG, HDL      ASSESSMENT AND PLAN:   1.  Chest pain/abnormal nuclear stress test: He underwent a nuclear stress test on 07/13/2019 which showed a moderate sized fixed inferior defect consistent with  old infarction with moderate reversibility of the inferolateral myocardium concerning for ischemia.  LVEF 50-55% by echocardiogram in March 2021.  He is on aspirin and Toprol-XL 50 mg daily.  I will increase Toprol-XL to 75 mg daily as he has rapid atrial fibrillation.  I will start atorvastatin 40 mg.  I will stop aspirin and start Xarelto 20 mg daily.  I will obtain a copy of labs from PCP.  I will arrange for cardiac catheterization. Risks and benefits of cardiac catheterization have been discussed with the patient.  These include bleeding, infection, kidney damage, stroke, heart attack, death.  The patient understands these risks and is willing to proceed.  2.  Hypertension: Blood pressure is elevated.  He is on Toprol-XL 50 mg daily.  I will increase to 75 mg daily.  3.  Tobacco abuse: He is smoked a pack of cigarettes daily for over 40 years.  He needs cessation.  I began to provide cessation counseling but he preferred not to talk about it at this time.  4.  Rapid atrial fibrillation: I will increase Toprol-XL to 75 mg daily.  I will stop aspirin and start Xarelto 20 mg daily.  5.  COPD: He needs tobacco cessation.  He likely needs a referral to a pulmonologist as he will need spirometry and optimization of medical management.    Disposition: Follow up in 1 month  Signed: Izzy Doubek, M.D., F.A.C.C.  08/09/2019, 4:17 PM   

## 2019-08-10 ENCOUNTER — Telehealth: Payer: Self-pay | Admitting: Cardiovascular Disease

## 2019-08-10 NOTE — Addendum Note (Signed)
Addended by: Burman Nieves T on: 08/10/2019 11:31 AM   Modules accepted: Orders

## 2019-08-10 NOTE — Telephone Encounter (Signed)
Pre-cert Verification for the following procedure     LHC 4/14 @ 1pm varanasi

## 2019-08-13 ENCOUNTER — Other Ambulatory Visit (HOSPITAL_COMMUNITY)
Admission: RE | Admit: 2019-08-13 | Discharge: 2019-08-13 | Disposition: A | Discharge status: Home / Self Care | Payer: Medicare HMO | Source: Ambulatory Visit | Attending: Cardiovascular Disease | Admitting: Cardiovascular Disease

## 2019-08-13 ENCOUNTER — Telehealth: Payer: Self-pay | Admitting: *Deleted

## 2019-08-13 ENCOUNTER — Other Ambulatory Visit: Payer: Self-pay

## 2019-08-13 ENCOUNTER — Other Ambulatory Visit (HOSPITAL_COMMUNITY)
Admission: RE | Admit: 2019-08-13 | Discharge: 2019-08-13 | Disposition: A | Discharge status: Home / Self Care | Payer: Medicare HMO | Source: Ambulatory Visit | Attending: Interventional Cardiology | Admitting: Interventional Cardiology

## 2019-08-13 DIAGNOSIS — R079 Chest pain, unspecified: Secondary | ICD-10-CM | POA: Insufficient documentation

## 2019-08-13 NOTE — Telephone Encounter (Signed)
Pt contacted pre-catheterization scheduled at [facility] for: Wednesday August 15, 2019 1 PM Verified arrival time and place: [facility] Main Entrance A [facility]) at: 11 AM   No solid food after midnight prior to cath, clear liquids until 5 AM day of procedure.  Hold: Xarelto-pt had taken 08/13/19 -last dose will be 7:30 AM 08/13/19 until post procedure.  Except hold medications AM meds can be  taken pre-cath with sip of water including: ASA 81 mg   Confirmed patient has responsible adult to drive home post procedure and observe 24 hours after arriving home: yes  Currently, due to Covid-19 pandemic, only one person will be allowed with patient. Must be the same person for patient's entire stay and will be required to wear a mask. They will be asked to wait in the waiting room for the duration of the patient's stay.  Patients are required to wear a mask when they enter the hospital.      COVID-19 Pre-Screening Questions:  . In the past 7 to 10 days have you had a cough,  shortness of breath, headache, congestion, fever (100 or greater) body aches, chills, sore throat, or sudden loss of taste or sense of smell? no . Have you been around anyone with known Covid 19 in the past 7 to 10 days? no . Have you been around anyone who is awaiting Covid 19 test results in the past 7 to 10 days? no . Have you been around anyone who has mentioned symptoms of Covid 19 within the past 7 to 10 days? no   I reviewed procedure/mask/visitor instructions, COVID-19 screening questions with patient.   Pt reminded to get pre procedure BMP/CBC/COVID-19 today at [facility] test site.

## 2019-08-14 ENCOUNTER — Telehealth: Payer: Self-pay | Admitting: Cardiovascular Disease

## 2019-08-14 NOTE — Telephone Encounter (Signed)
Call placed to patient, no answer, voicemail message.

## 2019-08-14 NOTE — Telephone Encounter (Signed)
Call placed to patient, no answer, voicemail message. 

## 2019-08-14 NOTE — Telephone Encounter (Signed)
Returned call to patient. Pt  states he went to [facility] yesterday for pre procedure BMP/CBC, he was not able to get BMP/CBC done because of confusion about why he was there, then it was too late to get COVID-19 test.   Pt states he took Xarelto today because he knew [facility] 08/15/19 would need to be rescheduled since he did not have pre procedure BMP/CBC/COVID-19. Pt advised I will cancel LHC scheduled 08/15/19, will forward to Dr Purvis Sheffield to follow up with him about rescheduling LHC and pre procedure BMP/CBC/COVID-19 test.  Pt knows to continue Xarelto daily  and when LHC is rescheduled he will need to hold 2 days prior to [facility].

## 2019-08-14 NOTE — Telephone Encounter (Signed)
Patient called asking about Covid test and blood work.  Stated he went to [facility] yesterday to do blood work and was told they had no record and didn't know what he was told to come to [facility].  Patient is very confused about what he is suppose to be getting done and asked if someone could please contact him and explain.   [phone]

## 2019-08-14 NOTE — Telephone Encounter (Signed)
Call placed to [facility] COVID-19 pre procedure test site to determine if patient had COVID-19 test yesterday. Per Britta Mccreedy at test site patient was no show.  LM for pt to call back to discuss.

## 2019-08-14 NOTE — Telephone Encounter (Signed)
Please reschedule cath and postpone follow up appointment.

## 2019-08-14 NOTE — Telephone Encounter (Signed)
See phone note 08/14/19 for additional details.

## 2019-08-15 ENCOUNTER — Ambulatory Visit (HOSPITAL_COMMUNITY)
Admission: RE | Admit: 2019-08-15 | Payer: Medicare HMO | Source: Home / Self Care | Admitting: Interventional Cardiology

## 2019-08-15 ENCOUNTER — Encounter (HOSPITAL_COMMUNITY): Admission: RE | Payer: Medicare HMO | Source: Home / Self Care

## 2019-08-15 SURGERY — LEFT HEART CATH AND CORONARY ANGIOGRAPHY
Anesthesia: LOCAL

## 2019-08-15 NOTE — Telephone Encounter (Signed)
Left heart cath rescheduled for Friday, 08/24/2019 at 7:30 am with Dr. Eldridge Dace.  He will arrive at 5:30 am to main entrance A at [facility].  NPO after midnight.  His last dose on the Xarelto will be Tuesday, 08/21/2019 & holding 4/21 & 4/22.  He may take all other medications the morning of cath.  He will be doing his labs at Dr. Cato Mulligan office in the morning.  CMET & Lipid was ordered by his pcp due to a recent wellness visit that he had with him.  I added the CBC to that patient will only have to do labs one time.  Covid screen scheduled for Tuesday, 08/21/2019 at 4:05 pm across from [facility].   All instructions given to patient with him verbalizing understanding.

## 2019-08-16 DIAGNOSIS — F17210 Nicotine dependence, cigarettes, uncomplicated: Secondary | ICD-10-CM | POA: Diagnosis not present

## 2019-08-16 DIAGNOSIS — Z13220 Encounter for screening for lipoid disorders: Secondary | ICD-10-CM | POA: Diagnosis not present

## 2019-08-16 DIAGNOSIS — K219 Gastro-esophageal reflux disease without esophagitis: Secondary | ICD-10-CM | POA: Diagnosis not present

## 2019-08-21 ENCOUNTER — Other Ambulatory Visit: Payer: Self-pay

## 2019-08-21 ENCOUNTER — Other Ambulatory Visit (HOSPITAL_COMMUNITY)
Admission: RE | Admit: 2019-08-21 | Discharge: 2019-08-21 | Disposition: A | Discharge status: Home / Self Care | Payer: Medicare HMO | Source: Ambulatory Visit | Attending: Interventional Cardiology | Admitting: Interventional Cardiology

## 2019-08-21 DIAGNOSIS — Z20822 Contact with and (suspected) exposure to covid-19: Secondary | ICD-10-CM | POA: Insufficient documentation

## 2019-08-21 DIAGNOSIS — Z01812 Encounter for preprocedural laboratory examination: Secondary | ICD-10-CM | POA: Diagnosis not present

## 2019-08-22 DIAGNOSIS — Z77090 Contact with and (suspected) exposure to asbestos: Secondary | ICD-10-CM | POA: Diagnosis not present

## 2019-08-22 DIAGNOSIS — F1210 Cannabis abuse, uncomplicated: Secondary | ICD-10-CM | POA: Diagnosis not present

## 2019-08-22 DIAGNOSIS — S6720XA Crushing injury of unspecified hand, initial encounter: Secondary | ICD-10-CM | POA: Diagnosis not present

## 2019-08-22 DIAGNOSIS — I517 Cardiomegaly: Secondary | ICD-10-CM | POA: Diagnosis not present

## 2019-08-22 DIAGNOSIS — I4891 Unspecified atrial fibrillation: Secondary | ICD-10-CM | POA: Diagnosis not present

## 2019-08-22 DIAGNOSIS — G629 Polyneuropathy, unspecified: Secondary | ICD-10-CM | POA: Diagnosis not present

## 2019-08-22 DIAGNOSIS — K429 Umbilical hernia without obstruction or gangrene: Secondary | ICD-10-CM | POA: Diagnosis not present

## 2019-08-22 DIAGNOSIS — J439 Emphysema, unspecified: Secondary | ICD-10-CM | POA: Diagnosis not present

## 2019-08-22 LAB — SARS CORONAVIRUS 2 (TAT 6-24 HRS): SARS Coronavirus 2: NEGATIVE

## 2019-08-24 ENCOUNTER — Ambulatory Visit (HOSPITAL_COMMUNITY)
Admission: RE | Admit: 2019-08-24 | Discharge: 2019-08-24 | Disposition: A | Discharge status: Home / Self Care | Payer: Medicare HMO | Attending: Interventional Cardiology | Admitting: Interventional Cardiology

## 2019-08-24 ENCOUNTER — Encounter (HOSPITAL_COMMUNITY)
Admission: RE | Disposition: A | Discharge status: Home / Self Care | Payer: Self-pay | Source: Home / Self Care | Attending: Interventional Cardiology

## 2019-08-24 DIAGNOSIS — I4891 Unspecified atrial fibrillation: Secondary | ICD-10-CM | POA: Insufficient documentation

## 2019-08-24 DIAGNOSIS — F329 Major depressive disorder, single episode, unspecified: Secondary | ICD-10-CM | POA: Diagnosis not present

## 2019-08-24 DIAGNOSIS — K219 Gastro-esophageal reflux disease without esophagitis: Secondary | ICD-10-CM | POA: Insufficient documentation

## 2019-08-24 DIAGNOSIS — F419 Anxiety disorder, unspecified: Secondary | ICD-10-CM | POA: Insufficient documentation

## 2019-08-24 DIAGNOSIS — R9439 Abnormal result of other cardiovascular function study: Secondary | ICD-10-CM | POA: Diagnosis not present

## 2019-08-24 DIAGNOSIS — G709 Myoneural disorder, unspecified: Secondary | ICD-10-CM | POA: Diagnosis not present

## 2019-08-24 DIAGNOSIS — Z7982 Long term (current) use of aspirin: Secondary | ICD-10-CM | POA: Diagnosis not present

## 2019-08-24 DIAGNOSIS — Z7901 Long term (current) use of anticoagulants: Secondary | ICD-10-CM | POA: Diagnosis not present

## 2019-08-24 DIAGNOSIS — F17210 Nicotine dependence, cigarettes, uncomplicated: Secondary | ICD-10-CM | POA: Insufficient documentation

## 2019-08-24 DIAGNOSIS — Z79899 Other long term (current) drug therapy: Secondary | ICD-10-CM | POA: Insufficient documentation

## 2019-08-24 DIAGNOSIS — I119 Hypertensive heart disease without heart failure: Secondary | ICD-10-CM | POA: Diagnosis not present

## 2019-08-24 DIAGNOSIS — J449 Chronic obstructive pulmonary disease, unspecified: Secondary | ICD-10-CM | POA: Insufficient documentation

## 2019-08-24 HISTORY — PX: LEFT HEART CATH AND CORONARY ANGIOGRAPHY: CATH118249

## 2019-08-24 LAB — BASIC METABOLIC PANEL
Anion gap: 7 (ref 5–15)
BUN: <5 mg/dL — ABNORMAL LOW (ref 6–20)
CO2: 27 mmol/L (ref 22–32)
Calcium: 8.3 mg/dL — ABNORMAL LOW (ref 8.9–10.3)
Chloride: 94 mmol/L — ABNORMAL LOW (ref 98–111)
Creatinine, Ser: 0.84 mg/dL (ref 0.61–1.24)
GFR calc Af Amer: >60 mL/min (ref >60)
GFR calc non Af Amer: >60 mL/min (ref >60)
Glucose, Bld: 105 mg/dL — ABNORMAL HIGH (ref 70–99)
Potassium: 4.2 mmol/L (ref 3.5–5.1)
Sodium: 128 mmol/L — ABNORMAL LOW (ref 135–145)

## 2019-08-24 LAB — CBC
HCT: 41.7 % (ref 39.0–52.0)
Hemoglobin: 13.8 g/dL (ref 13.0–17.0)
MCH: 31 pg (ref 26.0–34.0)
MCHC: 33.1 g/dL (ref 30.0–36.0)
MCV: 93.7 fL (ref 80.0–100.0)
Platelets: 350 10*3/uL (ref 150–400)
RBC: 4.45 MIL/uL (ref 4.22–5.81)
RDW: 15 % (ref 11.5–15.5)
WBC: 6.9 10*3/uL (ref 4.0–10.5)
nRBC: 0 % (ref 0.0–0.2)

## 2019-08-24 SURGERY — LEFT HEART CATH AND CORONARY ANGIOGRAPHY
Anesthesia: LOCAL

## 2019-08-24 MED ORDER — FENTANYL CITRATE (PF) 100 MCG/2ML IJ SOLN
INTRAMUSCULAR | Status: DC | PRN
  Administered 2019-08-24: 25 ug via INTRAVENOUS

## 2019-08-24 MED ORDER — HYDRALAZINE HCL 20 MG/ML IJ SOLN: 10.0000 mg | INTRAMUSCULAR | Status: DC | PRN

## 2019-08-24 MED ORDER — SODIUM CHLORIDE 0.9% FLUSH: 3.0000 mL | INTRAVENOUS | Status: DC | PRN

## 2019-08-24 MED ORDER — SODIUM CHLORIDE 0.9 % WEIGHT BASED INFUSION
3.0000 mL/kg/h | INTRAVENOUS | Status: AC
  Administered 2019-08-24: 3 mL/kg/h via INTRAVENOUS

## 2019-08-24 MED ORDER — HEPARIN (PORCINE) IN NACL 1000-0.9 UT/500ML-% IV SOLN
INTRAVENOUS | Status: AC
  Filled 2019-08-24: qty 1000

## 2019-08-24 MED ORDER — SODIUM CHLORIDE 0.9 % WEIGHT BASED INFUSION: 1.0000 mL/kg/h | INTRAVENOUS | Status: DC

## 2019-08-24 MED ORDER — HEPARIN (PORCINE) IN NACL 1000-0.9 UT/500ML-% IV SOLN
INTRAVENOUS | Status: DC | PRN
  Administered 2019-08-24 (×2): 500 mL

## 2019-08-24 MED ORDER — FENTANYL CITRATE (PF) 100 MCG/2ML IJ SOLN
INTRAMUSCULAR | Status: AC
  Filled 2019-08-24: qty 2

## 2019-08-24 MED ORDER — SODIUM CHLORIDE 0.9 % IV SOLN: INTRAVENOUS | Status: AC

## 2019-08-24 MED ORDER — ACETAMINOPHEN 325 MG PO TABS: 650.0000 mg | ORAL_TABLET | ORAL | Status: DC | PRN

## 2019-08-24 MED ORDER — LIDOCAINE-EPINEPHRINE 1 %-1:100000 IJ SOLN
INTRAMUSCULAR | Status: AC
  Filled 2019-08-24: qty 1

## 2019-08-24 MED ORDER — VERAPAMIL HCL 2.5 MG/ML IV SOLN
INTRAVENOUS | Status: DC | PRN
  Administered 2019-08-24: 10 mL via INTRA_ARTERIAL

## 2019-08-24 MED ORDER — SODIUM CHLORIDE 0.9% FLUSH: 3.0000 mL | Freq: Two times a day (BID) | INTRAVENOUS | Status: DC

## 2019-08-24 MED ORDER — HEPARIN SODIUM (PORCINE) 1000 UNIT/ML IJ SOLN
INTRAMUSCULAR | Status: DC | PRN
  Administered 2019-08-24: 5000 [IU] via INTRAVENOUS

## 2019-08-24 MED ORDER — VERAPAMIL HCL 2.5 MG/ML IV SOLN
INTRAVENOUS | Status: AC
  Filled 2019-08-24: qty 2

## 2019-08-24 MED ORDER — ONDANSETRON HCL 4 MG/2ML IJ SOLN: 4.0000 mg | Freq: Four times a day (QID) | INTRAMUSCULAR | Status: DC | PRN

## 2019-08-24 MED ORDER — LIDOCAINE HCL (PF) 1 % IJ SOLN
INTRAMUSCULAR | Status: DC | PRN
  Administered 2019-08-24: 2 mL via INTRADERMAL

## 2019-08-24 MED ORDER — ASPIRIN 81 MG PO CHEW
81.0000 mg | CHEWABLE_TABLET | ORAL | Status: AC
  Administered 2019-08-24: 06:00:00 81 mg via ORAL
  Filled 2019-08-24: qty 1

## 2019-08-24 MED ORDER — IOHEXOL 350 MG/ML SOLN
INTRAVENOUS | Status: DC | PRN
  Administered 2019-08-24: 50 mL via INTRA_ARTERIAL

## 2019-08-24 MED ORDER — MIDAZOLAM HCL 2 MG/2ML IJ SOLN
INTRAMUSCULAR | Status: DC | PRN
  Administered 2019-08-24: 1 mg via INTRAVENOUS

## 2019-08-24 MED ORDER — HEPARIN SODIUM (PORCINE) 1000 UNIT/ML IJ SOLN
INTRAMUSCULAR | Status: AC
  Filled 2019-08-24: qty 1

## 2019-08-24 MED ORDER — LIDOCAINE HCL (PF) 1 % IJ SOLN
INTRAMUSCULAR | Status: AC
  Filled 2019-08-24: qty 30

## 2019-08-24 MED ORDER — MIDAZOLAM HCL 2 MG/2ML IJ SOLN
INTRAMUSCULAR | Status: AC
  Filled 2019-08-24: qty 2

## 2019-08-24 MED ORDER — RIVAROXABAN 20 MG PO TABS: 20.0000 mg | ORAL_TABLET | Freq: Every day | ORAL | 0 refills | Status: AC

## 2019-08-24 MED ORDER — SODIUM CHLORIDE 0.9 % IV SOLN: 250.0000 mL | INTRAVENOUS | Status: DC | PRN

## 2019-08-24 MED ORDER — LABETALOL HCL 5 MG/ML IV SOLN: 10.0000 mg | INTRAVENOUS | Status: DC | PRN

## 2019-08-24 SURGICAL SUPPLY — 9 items

## 2019-08-24 NOTE — Progress Notes (Signed)
Pt up to bathroom, tolerated well.

## 2019-08-24 NOTE — Discharge Instructions (Signed)
Radial Site Care  This sheet gives you information about how to care for yourself after your procedure. Your health care provider may also give you more specific instructions. If you have problems or questions, contact your health care provider. What can I expect after the procedure? After the procedure, it is common to have:  Bruising and tenderness at the catheter insertion area. Follow these instructions at home: Medicines  Take over-the-counter and prescription medicines only as told by your health care provider. Insertion site care  Follow instructions from your health care provider about how to take care of your insertion site. Make sure you: ? Wash your hands with soap and water before you change your bandage (dressing). If soap and water are not available, use hand sanitizer. ? Change your dressing as told by your health care provider. ? Leave stitches (sutures), skin glue, or adhesive strips in place. These skin closures may need to stay in place for 2 weeks or longer. If adhesive strip edges start to loosen and curl up, you may trim the loose edges. Do not remove adhesive strips completely unless your health care provider tells you to do that.  Check your insertion site every day for signs of infection. Check for: ? Redness, swelling, or pain. ? Fluid or blood. ? Pus or a bad smell. ? Warmth.  Do not take baths, swim, or use a hot tub until your health care provider approves.  You may shower 24-48 hours after the procedure, or as directed by your health care provider. ? Remove the dressing and gently wash the site with plain soap and water. ? Pat the area dry with a clean towel. ? Do not rub the site. That could cause bleeding.  Do not apply powder or lotion to the site. Activity   For 24 hours after the procedure, or as directed by your health care provider: ? Do not flex or bend the affected arm. ? Do not push or pull heavy objects with the affected arm. ? Do not  drive yourself home from the hospital or clinic. You may drive 24 hours after the procedure unless your health care provider tells you not to. ? Do not operate machinery or power tools.  Do not lift anything that is heavier than 10 lb (4.5 kg), or the limit that you are told, until your health care provider says that it is safe.  Ask your health care provider when it is okay to: ? Return to work or school. ? Resume usual physical activities or sports. ? Resume sexual activity. General instructions  If the catheter site starts to bleed, raise your arm and put firm pressure on the site. If the bleeding does not stop, get help right away. This is a medical emergency.  If you went home on the same day as your procedure, a responsible adult should be with you for the first 24 hours after you arrive home.  Keep all follow-up visits as told by your health care provider. This is important. Contact a health care provider if:  You have a fever.  You have redness, swelling, or yellow drainage around your insertion site. Get help right away if:  You have unusual pain at the radial site.  The catheter insertion area swells very fast.  The insertion area is bleeding, and the bleeding does not stop when you hold steady pressure on the area.  Your arm or hand becomes pale, cool, tingly, or numb. These symptoms may represent a serious problem   that is an emergency. Do not wait to see if the symptoms will go away. Get medical help right away. Call your local emergency services (911 in the [place] Do not drive yourself to the hospital. Summary  After the procedure, it is common to have bruising and tenderness at the site.  Follow instructions from your health care provider about how to take care of your radial site wound. Check the wound every day for signs of infection.  Do not lift anything that is heavier than 10 lb (4.5 kg), or the limit that you are told, until your health care provider says  that it is safe. This information is not intended to replace advice given to you by your health care provider. Make sure you discuss any questions you have with your health care provider. Document Revised: 05/25/2017 Document Reviewed: 05/25/2017 Elsevier Patient Education  2020 [employer]vier Inc[employer]  

## 2019-08-24 NOTE — Interval H&P Note (Signed)
Cath Lab Visit (complete for each Cath Lab visit)  Clinical Evaluation Leading to the Procedure:   ACS: No.  Non-ACS:    Anginal Classification: CCS III  Anti-ischemic medical therapy: Minimal Therapy (1 class of medications)  Non-Invasive Test Results: Intermediate-risk stress test findings: cardiac mortality 1-3%/year  Prior CABG: No previous CABG      History and Physical Interval Note:  08/24/2019 8:14 AM  Fernando Holmes  has presented today for surgery, with the diagnosis of Abnormal stress test.  The various methods of treatment have been discussed with the patient and family. After consideration of risks, benefits and other options for treatment, the patient has consented to  Procedure(s): LEFT HEART CATH AND CORONARY ANGIOGRAPHY (N/A) as a surgical intervention.  The patient's history has been reviewed, patient examined, no change in status, stable for surgery.  I have reviewed the patient's chart and labs.  Questions were answered to the patient's satisfaction.     Lance Muss

## 2019-09-11 NOTE — Progress Notes (Deleted)
Cardiology Office Note  Date: 09/11/2019   ID: Fernando Holmes, DOB Sep 18, 1960, MRN [id number]  PCP:  Juliette Alcide, MD  Cardiologist:  Prentice Docker, MD Electrophysiologist:  None   Chief Complaint: F/U CP, abnormal stress test  History of Present Illness: Fernando Holmes is a 59 y.o. male with a history of chest pain, abnormal stress test, atrial fibrillation RVR, hypertension, tobacco abuse, COPD  Last seen by Dr. Purvis Sheffield 08/09/2019 with complaint of chest pain and abnormal stress test.  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  Echocardiogram March 2021 showed EF of 50 to 55%.  He was taking Toprol 50 mg daily.  Toprol was increased to 75 mg will control with his rapid atrial fibrillation.  Atorvastatin was started for hyperlipidemia.  Aspirin was stopped and Xarelto was started at 20 mg daily.  Dr. Purvis Sheffield considered sending him total pulmonology to evaluate and treat COPD.  Patient was not interested in talking about smoking cessation at that visit.  If subsequent cardiac catheterization on 08/24/2019.  His LVEF was normal.  LVEDP mildly elevated.  EF 50 to 55%.  No aortic valve stenosis.  No angiographically apparent coronary artery disease.    Past Medical History:  Diagnosis Date  . Anxiety   . Chronic pain due to injury 2016   hands crushed by hydrolic press  . Depression   . Dyspnea   . GERD (gastroesophageal reflux disease)   . Neuromuscular disorder [facility])     Past Surgical History:  Procedure Laterality Date  . CARPAL TUNNEL RELEASE Left 07/06/2016   Procedure: CARPAL TUNNEL RELEASE;  Surgeon: Cindee Salt, MD;  Location: [facility];  Service: Orthopedics;  Laterality: Left;  . CARPAL TUNNEL RELEASE Right 03/08/2017   Procedure: DECOMPRESSION MEDIAN NERVE;  Surgeon: Cindee Salt, MD;  Location: [facility];  Service: Orthopedics;  Laterality: Right;  . HAND SURGERY Left 02/14/2015   due to injury  . KNEE ARTHROSCOPY W/ ACL RECONSTRUCTION Left 2000  . LEFT HEART CATH AND CORONARY ANGIOGRAPHY N/A 08/24/2019   Procedure: LEFT HEART CATH AND CORONARY ANGIOGRAPHY;  Surgeon: Corky Crafts, MD;  Location: [facility] INVASIVE CV LAB;  Service: Cardiovascular;  Laterality: N/A;  . ULNAR NERVE TRANSPOSITION Left 07/06/2016   Procedure: LEFT ULNAR NERVE DECOMPRESSION,;  Surgeon: Cindee Salt, MD;  Location: [facility];  Service: Orthopedics;  Laterality: Left;  . ULNAR NERVE TRANSPOSITION Right 03/08/2017   Procedure: DECOMPRESSION ULNAR NERVE RIGHT;  Surgeon: Cindee Salt, MD;  Location:  SURGERY CENTER;  Service: Orthopedics;  Laterality: Right;    Current Outpatient Medications  Medication Sig Dispense Refill  . albuterol (VENTOLIN HFA) 108 (90 Base) MCG/ACT inhaler Inhale 2 puffs into the lungs every 4 (four) hours as needed for wheezing or shortness of breath.     Marland Kitchen atorvastatin (LIPITOR) 40 MG tablet Take 1 tablet (40 mg total) by mouth daily. 90 tablet 1  . DULoxetine (CYMBALTA) 60 MG capsule Take 60 mg by mouth daily.     . metoprolol succinate (TOPROL-XL) 50 MG 24 hr tablet Take 1.5 tablets (75 mg total) by mouth daily. 135 tablet 1  . nitroGLYCERIN (NITROSTAT) 0.4 MG SL tablet Place 1 tablet (0.4 mg total) under the tongue every 5 (five) minutes as needed for chest pain. 25 tablet 3  . rivaroxaban (XARELTO) 20 MG TABS tablet Take 1 tablet (20 mg total)  by mouth daily with supper. 28 tablet 0  . SPIRIVA HANDIHALER 18 MCG inhalation capsule Place 1 capsule into inhaler and inhale daily.    . Tetrahydrozoline HCl (VISINE OP) Place 1 drop into both eyes daily as needed (irritation).     No current facility-administered medications for this visit.   Allergies:  Patient has no known allergies.   Social History: The patient  reports that he has been smoking cigarettes. He  started smoking about 48 years ago. He has a 40.00 pack-year smoking history. He has never used smokeless tobacco. He reports current alcohol use of about 3.0 standard drinks of alcohol per week. He reports current drug use. Drug: Marijuana.   Family History: The patient's family history includes CAD in his father; Cancer in his mother; Depression in his sister; Hypertension in his father; Other (age of onset: 63) in his brother.   ROS:  Please see the history of present illness. Otherwise, complete review of systems is positive for none.  All other systems are reviewed and negative.   Physical Exam: VS:  There were no vitals taken for this visit., BMI There is no height or weight on file to calculate BMI.  Wt Readings from Last 3 Encounters:  08/24/19 210 lb (95.3 kg)  08/09/19 209 lb 9.6 oz (95.1 kg)  03/08/17 213 lb (96.6 kg)    General: Patient appears comfortable at rest. HEENT: Conjunctiva and lids normal, oropharynx clear with moist mucosa. Neck: Supple, no elevated JVP or carotid bruits, no thyromegaly. Lungs: Clear to auscultation, nonlabored breathing at rest. Cardiac: Regular rate and rhythm, no S3 or significant systolic murmur, no pericardial rub. Abdomen: Soft, nontender, no hepatomegaly, bowel sounds present, no guarding or rebound. Extremities: No pitting edema, distal pulses 2+. Skin: Warm and dry. Musculoskeletal: No kyphosis. Neuropsychiatric: Alert and oriented x3, affect grossly appropriate.  ECG:  {EKG/Telemetry Strips Reviewed:[phone]}  Recent Labwork: 08/24/2019: BUN <5; Creatinine, Ser 0.84; Hemoglobin 13.8; Platelets 350; Potassium 4.2; Sodium 128  No results found for: CHOL, TRIG, HDL, CHOLHDL, VLDL, LDLCALC, LDLDIRECT  Other Studies Reviewed Today:  Cardiac catheterization 08/24/2019   The left ventricular systolic function is normal.  LV end diastolic pressure is mildly elevated.  The left ventricular ejection fraction is 50-55% by visual  estimate.  There is no aortic valve stenosis.  No angiographically apparent coronary artery disease.    Assessment and Plan:  1. Chest pain, unspecified type   2. Essential hypertension   3. Abnormal nuclear stress test   4. Tobacco abuse   5. Chronic obstructive pulmonary disease, unspecified COPD type ([place])    1. Chest pain, unspecified type Seen by Dr. Bronson Ing at last visit with complaint of chest pain.  Subsequent abnormal stress test.  Cardiac catheterization showed no angiogram graphically significant coronary artery disease.  2. Essential hypertension ***  3. Abnormal nuclear stress test Recent abnormal stress test referred by PCP.  Patient is status post cardiac catheterization with no angiographically apparent coronary artery disease.  4. Tobacco abuse Patient is a current everyday smoker and longtime history of smoking.  At last visit he was not interested in discussing smoking cessation.  5. Chronic obstructive pulmonary disease, unspecified COPD type ([place]) At last visit there was discussion of patient probably needing a pulmonary consult for COPD    Medication Adjustments/Labs and Tests Ordered: Current medicines are reviewed at length with the patient today.  Concerns regarding medicines are outlined above.   Disposition: Follow-up with ***  Signed, Levell July, NP  09/11/2019 11:37 PM    [facility] [address] [place], [place], [place] [address] Phone: [phone]; Fax: [phone]

## 2019-09-12 ENCOUNTER — Ambulatory Visit: Payer: Medicare HMO | Admitting: Family Medicine

## 2019-09-12 DIAGNOSIS — I10 Essential (primary) hypertension: Secondary | ICD-10-CM

## 2019-09-12 DIAGNOSIS — Z720 Tobacco use: Secondary | ICD-10-CM

## 2019-09-12 DIAGNOSIS — R079 Chest pain, unspecified: Secondary | ICD-10-CM

## 2019-09-12 DIAGNOSIS — J449 Chronic obstructive pulmonary disease, unspecified: Secondary | ICD-10-CM

## 2019-09-12 DIAGNOSIS — R9439 Abnormal result of other cardiovascular function study: Secondary | ICD-10-CM

## 2019-09-20 DIAGNOSIS — I4821 Permanent atrial fibrillation: Secondary | ICD-10-CM | POA: Diagnosis not present

## 2019-09-20 DIAGNOSIS — J441 Chronic obstructive pulmonary disease with (acute) exacerbation: Secondary | ICD-10-CM | POA: Diagnosis not present

## 2019-09-20 DIAGNOSIS — J90 Pleural effusion, not elsewhere classified: Secondary | ICD-10-CM | POA: Diagnosis not present

## 2019-09-20 DIAGNOSIS — J9621 Acute and chronic respiratory failure with hypoxia: Secondary | ICD-10-CM | POA: Diagnosis not present

## 2019-09-20 DIAGNOSIS — Z720 Tobacco use: Secondary | ICD-10-CM | POA: Diagnosis not present

## 2019-09-20 DIAGNOSIS — Z7901 Long term (current) use of anticoagulants: Secondary | ICD-10-CM | POA: Diagnosis not present

## 2019-09-20 DIAGNOSIS — I110 Hypertensive heart disease with heart failure: Secondary | ICD-10-CM | POA: Diagnosis not present

## 2019-09-20 DIAGNOSIS — F17210 Nicotine dependence, cigarettes, uncomplicated: Secondary | ICD-10-CM | POA: Diagnosis not present

## 2019-09-20 DIAGNOSIS — I4819 Other persistent atrial fibrillation: Secondary | ICD-10-CM | POA: Diagnosis not present

## 2019-09-20 DIAGNOSIS — I5031 Acute diastolic (congestive) heart failure: Secondary | ICD-10-CM | POA: Diagnosis not present

## 2019-09-20 DIAGNOSIS — I10 Essential (primary) hypertension: Secondary | ICD-10-CM | POA: Diagnosis not present

## 2019-09-20 DIAGNOSIS — J9811 Atelectasis: Secondary | ICD-10-CM | POA: Diagnosis not present

## 2019-09-20 DIAGNOSIS — J9601 Acute respiratory failure with hypoxia: Secondary | ICD-10-CM | POA: Diagnosis not present

## 2019-09-20 DIAGNOSIS — R0602 Shortness of breath: Secondary | ICD-10-CM | POA: Diagnosis not present

## 2019-09-20 DIAGNOSIS — F418 Other specified anxiety disorders: Secondary | ICD-10-CM | POA: Diagnosis not present

## 2019-10-31 DIAGNOSIS — I4891 Unspecified atrial fibrillation: Secondary | ICD-10-CM | POA: Diagnosis not present

## 2019-10-31 DIAGNOSIS — G4733 Obstructive sleep apnea (adult) (pediatric): Secondary | ICD-10-CM | POA: Diagnosis not present

## 2019-10-31 DIAGNOSIS — F1210 Cannabis abuse, uncomplicated: Secondary | ICD-10-CM | POA: Diagnosis not present

## 2019-10-31 DIAGNOSIS — Z6831 Body mass index (BMI) 31.0-31.9, adult: Secondary | ICD-10-CM | POA: Diagnosis not present

## 2019-10-31 DIAGNOSIS — I509 Heart failure, unspecified: Secondary | ICD-10-CM | POA: Diagnosis not present

## 2019-10-31 DIAGNOSIS — J439 Emphysema, unspecified: Secondary | ICD-10-CM | POA: Diagnosis not present

## 2019-10-31 DIAGNOSIS — F17210 Nicotine dependence, cigarettes, uncomplicated: Secondary | ICD-10-CM | POA: Diagnosis not present

## 2019-10-31 DIAGNOSIS — S6720XA Crushing injury of unspecified hand, initial encounter: Secondary | ICD-10-CM | POA: Diagnosis not present

## 2019-11-01 DIAGNOSIS — Z720 Tobacco use: Secondary | ICD-10-CM | POA: Diagnosis not present

## 2019-11-01 DIAGNOSIS — I10 Essential (primary) hypertension: Secondary | ICD-10-CM | POA: Diagnosis not present

## 2019-11-01 DIAGNOSIS — J418 Mixed simple and mucopurulent chronic bronchitis: Secondary | ICD-10-CM | POA: Diagnosis not present

## 2019-11-01 DIAGNOSIS — I4819 Other persistent atrial fibrillation: Secondary | ICD-10-CM | POA: Diagnosis not present

## 2019-11-01 DIAGNOSIS — I5032 Chronic diastolic (congestive) heart failure: Secondary | ICD-10-CM | POA: Diagnosis not present

## 2020-01-14 DIAGNOSIS — I4811 Longstanding persistent atrial fibrillation: Secondary | ICD-10-CM | POA: Diagnosis not present

## 2020-01-14 DIAGNOSIS — I10 Essential (primary) hypertension: Secondary | ICD-10-CM | POA: Diagnosis not present

## 2020-01-14 DIAGNOSIS — Z720 Tobacco use: Secondary | ICD-10-CM | POA: Diagnosis not present

## 2020-01-14 DIAGNOSIS — I5032 Chronic diastolic (congestive) heart failure: Secondary | ICD-10-CM | POA: Diagnosis not present

## 2020-01-17 DIAGNOSIS — I509 Heart failure, unspecified: Secondary | ICD-10-CM | POA: Diagnosis not present

## 2020-01-17 DIAGNOSIS — Z23 Encounter for immunization: Secondary | ICD-10-CM | POA: Diagnosis not present

## 2020-01-17 DIAGNOSIS — S6720XA Crushing injury of unspecified hand, initial encounter: Secondary | ICD-10-CM | POA: Diagnosis not present

## 2020-01-17 DIAGNOSIS — F1210 Cannabis abuse, uncomplicated: Secondary | ICD-10-CM | POA: Diagnosis not present

## 2020-01-17 DIAGNOSIS — I4891 Unspecified atrial fibrillation: Secondary | ICD-10-CM | POA: Diagnosis not present

## 2020-01-17 DIAGNOSIS — J439 Emphysema, unspecified: Secondary | ICD-10-CM | POA: Diagnosis not present

## 2020-01-17 DIAGNOSIS — Z6830 Body mass index (BMI) 30.0-30.9, adult: Secondary | ICD-10-CM | POA: Diagnosis not present

## 2020-01-17 DIAGNOSIS — F339 Major depressive disorder, recurrent, unspecified: Secondary | ICD-10-CM | POA: Diagnosis not present

## 2020-02-01 DEATH — deceased
